# Patient Record
Sex: Female | Born: 1940 | Race: White | Hispanic: No | Marital: Married | Smoking: Former smoker
Health system: Southern US, Community
[De-identification: ages and names within clinical notes are randomized; demographics above are authoritative.]

## PROBLEM LIST (undated history)

## (undated) DIAGNOSIS — G5600 Carpal tunnel syndrome, unspecified upper limb: Secondary | ICD-10-CM

## (undated) DIAGNOSIS — F419 Anxiety disorder, unspecified: Secondary | ICD-10-CM

## (undated) DIAGNOSIS — R42 Dizziness and giddiness: Secondary | ICD-10-CM

## (undated) DIAGNOSIS — T887XXA Unspecified adverse effect of drug or medicament, initial encounter: Secondary | ICD-10-CM

## (undated) DIAGNOSIS — G40309 Generalized idiopathic epilepsy and epileptic syndromes, not intractable, without status epilepticus: Secondary | ICD-10-CM

## (undated) DIAGNOSIS — R251 Tremor, unspecified: Secondary | ICD-10-CM

## (undated) DIAGNOSIS — R413 Other amnesia: Secondary | ICD-10-CM

## (undated) HISTORY — DX: Anxiety disorder, unspecified: F41.9

## (undated) HISTORY — DX: Unspecified adverse effect of drug or medicament, initial encounter: T88.7XXA

## (undated) HISTORY — DX: Dizziness and giddiness: R42

## (undated) HISTORY — DX: Carpal tunnel syndrome, unspecified upper limb: G56.00

## (undated) HISTORY — PX: ABDOMINAL HYSTERECTOMY: SHX81

## (undated) HISTORY — DX: Other amnesia: R41.3

## (undated) HISTORY — PX: TONSILLECTOMY AND ADENOIDECTOMY: SHX28

## (undated) HISTORY — DX: Generalized idiopathic epilepsy and epileptic syndromes, not intractable, without status epilepticus: G40.309

## (undated) HISTORY — PX: APPENDECTOMY: SHX54

---

## 2007-02-20 ENCOUNTER — Emergency Department: Payer: Self-pay | Admitting: Emergency Medicine

## 2007-02-20 ENCOUNTER — Other Ambulatory Visit: Payer: Self-pay

## 2007-03-07 ENCOUNTER — Ambulatory Visit: Payer: Self-pay | Admitting: Internal Medicine

## 2007-07-04 ENCOUNTER — Emergency Department: Payer: Self-pay | Admitting: Emergency Medicine

## 2007-07-04 ENCOUNTER — Other Ambulatory Visit: Payer: Self-pay

## 2012-01-27 ENCOUNTER — Ambulatory Visit: Payer: Self-pay | Admitting: Ophthalmology

## 2012-02-10 ENCOUNTER — Ambulatory Visit: Payer: Self-pay | Admitting: Ophthalmology

## 2013-01-02 ENCOUNTER — Encounter: Payer: Self-pay | Admitting: Neurology

## 2013-01-02 ENCOUNTER — Ambulatory Visit (INDEPENDENT_AMBULATORY_CARE_PROVIDER_SITE_OTHER): Payer: No Typology Code available for payment source | Admitting: Neurology

## 2013-01-02 VITALS — BP 121/64 | HR 71 | Ht 64.0 in | Wt 139.0 lb

## 2013-01-02 DIAGNOSIS — F419 Anxiety disorder, unspecified: Secondary | ICD-10-CM

## 2013-01-02 DIAGNOSIS — R42 Dizziness and giddiness: Secondary | ICD-10-CM | POA: Insufficient documentation

## 2013-01-02 DIAGNOSIS — R569 Unspecified convulsions: Secondary | ICD-10-CM

## 2013-01-02 DIAGNOSIS — R413 Other amnesia: Secondary | ICD-10-CM

## 2013-01-02 DIAGNOSIS — G5600 Carpal tunnel syndrome, unspecified upper limb: Secondary | ICD-10-CM | POA: Insufficient documentation

## 2013-01-02 DIAGNOSIS — T887XXA Unspecified adverse effect of drug or medicament, initial encounter: Secondary | ICD-10-CM

## 2013-01-02 MED ORDER — ZONISAMIDE 100 MG PO CAPS: 100.0000 mg | ORAL_CAPSULE | Freq: Every day | ORAL | Status: DC

## 2013-01-02 NOTE — Progress Notes (Signed)
History of Present Illness:    Nicole Burnett is a 72 year old right-handed white married female with a history of nocturnal seizures x2 occurring 02/20/2007 and 07/04/2007, accompanied by her husband.  CT scan of the brain, EEG x2, and MRI studies of the brain with and without contrast enhancement 05/16/2007 and 12/17/2008 are normal. The latter was read as showing atrophy. She has not had recurrent seizures and denies macropsia, micropsia, dj vu, strange odors or tastes. She is independent activities of daily living, enjoys playing golf, and drives a car. She has been concerned about memory loss but this has not been noted by her husband. She developed benign paroxysmal positional vertigo with right posterior ear canal disease which I treated 12/06/2008 with a modified Epley maneuver and she has done well without recurrent symptoms.  She complains of ringing in the ears right greater than left which is constant without hearing loss. She is not having side effects from her medication. She states her memory is " the same". She is independent in all activities of daily living and pays the monthly bills .She enjoys memory gains that are word  games. Zonogram causes her food to  taste like "there is iron in it". She denies auras of seizures.  She is on one baby aspirin per day.    Last blood work 06/24/10 by Dr. Arlana Pouch. She has been under stress,while  while  handling her brother's estate following his death. She has glaucoma and has undergone laser surgery. She has bilateral cataracts followed by Dr. Sharman Crate and had left eye surgery 12/2011 and right eye surgery 01/2012. She exercises by playing golf. She does not do other forms of exercise during the winter.Her last DEXA scan was 01/18/2012 with lowest  T score  -1.5 of the  right femoral neck. last  troughs in a semi-level 10.3 on 12/29/2011 with therapeutic 10-40. She complains of feeling tired.  She sleeps well at night.  She does not snore. She does not take daytime  naps.   UPDATE July 14th 2014: She is doing very well, there was no recurrent seizure, zonisamide trough level was 9.8 in January 2014, Her medication was increased from 100 twice a day to 100 in the morning 200 every night.  She denies significant side effects, she denies dizziness.   ROS:   Ringing in ears, anxiety, decreased energy, memory loss.  Physical Exam  General:  well-developed white female.Weight 142.5 pounds. Sitting blood pressure right arm 140/80 and  left arm 130/80. Heart rate 68 and regular Right shoulder higher than the left. Mild scoliosis  Neurologic Exam  Mental Status: Alert and oriented to time, place, and person. MMSE 30/30 Cranial Nerves: . visual fields full. discs deeply cupped Status post cataract surgery bilaterally. Extraocular movements full.  Exophoria Corneals are present.  Hearing intact.  Air conduction greater than bone conduction.  No facial asymmetry.  Tongue midline, uvula midline, and gag present.  Sternocleidomastoid and trapezius testing normal. Motor: 5/5 strength proximally and distally in the upper and lower extremities.  No evidence of proximal, pronator, or distal drift.  No focal atrophy and no fasciculations seen. Sensory: Intact to pinprick, light touch, joint position, and vibration testing.    Coordination:  Outstretched hand and arm tremor.  Intact finger-to-nose, heel-to-shin, and rapid alernating movements.  No evidence of rebound. Gait and Station: Can toe walk, heel walk, and tandem gait.  Romberg testing is negative. Can walk up and down the hall 6x with  5 turns in 48 seconds  Reflexes: Postural and righting reflexes are normal.  DTR 2+ and equal.  Plantar responses downgoing.  Assessment and plan: 72 years old Caucasian female, with  history generalized major motor seizures x2, doing very well with zonisamide 100 mg at noon, 2 tablets every night,  Keep current dose of medications, refill her prescription, return to clinic as needed,  further prescription can be field by her primary care physician Dr. Arlana Pouch.

## 2013-01-25 ENCOUNTER — Other Ambulatory Visit: Payer: Self-pay

## 2013-04-27 ENCOUNTER — Other Ambulatory Visit: Payer: Self-pay

## 2013-10-13 ENCOUNTER — Ambulatory Visit: Payer: Self-pay | Admitting: Unknown Physician Specialty

## 2014-01-07 ENCOUNTER — Other Ambulatory Visit: Payer: Self-pay | Admitting: Neurology

## 2014-02-06 ENCOUNTER — Other Ambulatory Visit: Payer: Self-pay | Admitting: Neurology

## 2014-02-06 NOTE — Telephone Encounter (Signed)
Last OV note says:  zonisamide 100 mg at noon, 2 tablets every night,  Keep current dose of medications, refill her prescription, return to clinic as needed, further prescription can be field by her primary care physician Dr. Arlana Pouchate.

## 2014-03-07 ENCOUNTER — Other Ambulatory Visit: Payer: Self-pay | Admitting: Neurology

## 2017-01-20 DEATH — deceased

## 2018-02-01 ENCOUNTER — Encounter: Payer: Self-pay | Admitting: Emergency Medicine

## 2018-02-01 ENCOUNTER — Emergency Department: Payer: Medicare HMO

## 2018-02-01 ENCOUNTER — Observation Stay
Admission: EM | Admit: 2018-02-01 | Discharge: 2018-02-02 | Disposition: A | Discharge status: Home / Self Care | Payer: Medicare HMO | Attending: Internal Medicine | Admitting: Internal Medicine

## 2018-02-01 ENCOUNTER — Other Ambulatory Visit: Payer: Self-pay

## 2018-02-01 DIAGNOSIS — Z8249 Family history of ischemic heart disease and other diseases of the circulatory system: Secondary | ICD-10-CM | POA: Insufficient documentation

## 2018-02-01 DIAGNOSIS — R52 Pain, unspecified: Secondary | ICD-10-CM | POA: Diagnosis present

## 2018-02-01 DIAGNOSIS — Z7982 Long term (current) use of aspirin: Secondary | ICD-10-CM | POA: Diagnosis not present

## 2018-02-01 DIAGNOSIS — R413 Other amnesia: Secondary | ICD-10-CM | POA: Diagnosis not present

## 2018-02-01 DIAGNOSIS — Z87891 Personal history of nicotine dependence: Secondary | ICD-10-CM | POA: Diagnosis not present

## 2018-02-01 DIAGNOSIS — Z888 Allergy status to other drugs, medicaments and biological substances status: Secondary | ICD-10-CM | POA: Diagnosis not present

## 2018-02-01 DIAGNOSIS — G5600 Carpal tunnel syndrome, unspecified upper limb: Secondary | ICD-10-CM | POA: Diagnosis not present

## 2018-02-01 DIAGNOSIS — F419 Anxiety disorder, unspecified: Secondary | ICD-10-CM | POA: Diagnosis not present

## 2018-02-01 DIAGNOSIS — Z91030 Bee allergy status: Secondary | ICD-10-CM | POA: Diagnosis not present

## 2018-02-01 DIAGNOSIS — G40909 Epilepsy, unspecified, not intractable, without status epilepticus: Secondary | ICD-10-CM | POA: Insufficient documentation

## 2018-02-01 DIAGNOSIS — R42 Dizziness and giddiness: Secondary | ICD-10-CM | POA: Insufficient documentation

## 2018-02-01 DIAGNOSIS — N200 Calculus of kidney: Secondary | ICD-10-CM

## 2018-02-01 DIAGNOSIS — Z79899 Other long term (current) drug therapy: Secondary | ICD-10-CM | POA: Insufficient documentation

## 2018-02-01 DIAGNOSIS — Z886 Allergy status to analgesic agent status: Secondary | ICD-10-CM | POA: Insufficient documentation

## 2018-02-01 DIAGNOSIS — Z90710 Acquired absence of both cervix and uterus: Secondary | ICD-10-CM | POA: Diagnosis not present

## 2018-02-01 DIAGNOSIS — N132 Hydronephrosis with renal and ureteral calculous obstruction: Secondary | ICD-10-CM | POA: Diagnosis not present

## 2018-02-01 LAB — BASIC METABOLIC PANEL
Anion gap: 9 (ref 5–15)
BUN: 25 mg/dL — AB (ref 8–23)
CHLORIDE: 106 mmol/L (ref 98–111)
CO2: 25 mmol/L (ref 22–32)
CREATININE: 0.77 mg/dL (ref 0.44–1.00)
Calcium: 9.8 mg/dL (ref 8.9–10.3)
GFR calc Af Amer: >60 mL/min (ref >60)
GFR calc non Af Amer: >60 mL/min (ref >60)
Glucose, Bld: 113 mg/dL — ABNORMAL HIGH (ref 70–99)
Potassium: 4 mmol/L (ref 3.5–5.1)
Sodium: 140 mmol/L (ref 135–145)

## 2018-02-01 LAB — URINALYSIS, COMPLETE (UACMP) WITH MICROSCOPIC
BILIRUBIN URINE: NEGATIVE
Bacteria, UA: NONE SEEN
GLUCOSE, UA: NEGATIVE mg/dL
Ketones, ur: NEGATIVE mg/dL
NITRITE: NEGATIVE
Protein, ur: NEGATIVE mg/dL
Specific Gravity, Urine: 1.016 (ref 1.005–1.030)
pH: 6 (ref 5.0–8.0)

## 2018-02-01 LAB — CBC
HCT: 43.6 % (ref 35.0–47.0)
HEMOGLOBIN: 14.4 g/dL (ref 12.0–16.0)
MCH: 33.5 pg (ref 26.0–34.0)
MCHC: 33.1 g/dL (ref 32.0–36.0)
MCV: 101.3 fL — ABNORMAL HIGH (ref 80.0–100.0)
PLATELETS: 223 10*3/uL (ref 150–440)
RBC: 4.31 MIL/uL (ref 3.80–5.20)
RDW: 13.3 % (ref 11.5–14.5)
WBC: 8.1 10*3/uL (ref 3.6–11.0)

## 2018-02-01 MED ORDER — ONDANSETRON HCL 4 MG/2ML IJ SOLN
4.0000 mg | Freq: Once | INTRAMUSCULAR | Status: AC
  Administered 2018-02-01: 4 mg via INTRAVENOUS
  Filled 2018-02-01: qty 2

## 2018-02-01 MED ORDER — OXYCODONE HCL 5 MG PO TABS
5.0000 mg | ORAL_TABLET | ORAL | Status: DC | PRN
  Administered 2018-02-02: 5 mg via ORAL
  Filled 2018-02-01: qty 1

## 2018-02-01 MED ORDER — SODIUM CHLORIDE 0.9 % IV SOLN
INTRAVENOUS | Status: AC
  Administered 2018-02-02: via INTRAVENOUS

## 2018-02-01 MED ORDER — OXYCODONE-ACETAMINOPHEN 5-325 MG PO TABS
ORAL_TABLET | ORAL | Status: AC
  Filled 2018-02-01: qty 1

## 2018-02-01 MED ORDER — ONDANSETRON HCL 4 MG/2ML IJ SOLN: 4.0000 mg | Freq: Four times a day (QID) | INTRAMUSCULAR | Status: DC | PRN

## 2018-02-01 MED ORDER — MORPHINE SULFATE (PF) 2 MG/ML IV SOLN
2.0000 mg | Freq: Once | INTRAVENOUS | Status: AC
  Administered 2018-02-01: 2 mg via INTRAVENOUS
  Filled 2018-02-01: qty 1

## 2018-02-01 MED ORDER — ACETAMINOPHEN 650 MG RE SUPP
650.0000 mg | Freq: Four times a day (QID) | RECTAL | Status: DC | PRN
Start: 2018-02-01 — End: 2018-02-02

## 2018-02-01 MED ORDER — ONDANSETRON HCL 4 MG PO TABS: 4.0000 mg | ORAL_TABLET | Freq: Four times a day (QID) | ORAL | Status: DC | PRN

## 2018-02-01 MED ORDER — ENOXAPARIN SODIUM 40 MG/0.4ML ~~LOC~~ SOLN
40.0000 mg | SUBCUTANEOUS | Status: DC
  Administered 2018-02-02: 40 mg via SUBCUTANEOUS
  Filled 2018-02-01: qty 0.4

## 2018-02-01 MED ORDER — ZONISAMIDE 100 MG PO CAPS
200.0000 mg | ORAL_CAPSULE | Freq: Every day | ORAL | Status: DC
  Filled 2018-02-01 (×2): qty 2

## 2018-02-01 MED ORDER — ZONISAMIDE 100 MG PO CAPS
100.0000 mg | ORAL_CAPSULE | Freq: Every day | ORAL | Status: DC
  Administered 2018-02-02: 100 mg via ORAL
  Filled 2018-02-01: qty 1

## 2018-02-01 MED ORDER — LATANOPROST 0.005 % OP SOLN
1.0000 [drp] | Freq: Every day | OPHTHALMIC | Status: DC
  Administered 2018-02-02: 1 [drp] via OPHTHALMIC
  Filled 2018-02-01: qty 2.5

## 2018-02-01 MED ORDER — SODIUM CHLORIDE 0.9 % IV BOLUS
500.0000 mL | Freq: Once | INTRAVENOUS | Status: AC
  Administered 2018-02-01: 500 mL via INTRAVENOUS

## 2018-02-01 MED ORDER — ZONISAMIDE 100 MG PO CAPS
100.0000 mg | ORAL_CAPSULE | Freq: Two times a day (BID) | ORAL | Status: DC
Start: 2018-02-01 — End: 2018-02-01

## 2018-02-01 MED ORDER — ACETAMINOPHEN 325 MG PO TABS: 650.0000 mg | ORAL_TABLET | Freq: Four times a day (QID) | ORAL | Status: DC | PRN

## 2018-02-01 MED ORDER — MORPHINE SULFATE (PF) 4 MG/ML IV SOLN
4.0000 mg | Freq: Once | INTRAVENOUS | Status: AC
  Administered 2018-02-01: 4 mg via INTRAVENOUS
  Filled 2018-02-01: qty 1

## 2018-02-01 MED ORDER — BRIMONIDINE TARTRATE 0.15 % OP SOLN
1.0000 [drp] | Freq: Two times a day (BID) | OPHTHALMIC | Status: DC
  Administered 2018-02-02 (×2): 1 [drp] via OPHTHALMIC
  Filled 2018-02-01: qty 5

## 2018-02-01 MED ORDER — OXYCODONE-ACETAMINOPHEN 5-325 MG PO TABS
1.0000 | ORAL_TABLET | ORAL | Status: DC | PRN
  Administered 2018-02-01: 1 via ORAL

## 2018-02-01 MED ORDER — MORPHINE SULFATE (PF) 4 MG/ML IV SOLN
4.0000 mg | INTRAVENOUS | Status: DC | PRN
  Administered 2018-02-02: 4 mg via INTRAVENOUS
  Filled 2018-02-01: qty 1

## 2018-02-01 NOTE — ED Triage Notes (Signed)
Patient reports left-sided flank pain that started yesterday and comes and goes. Denies history of kidney stones. Denies urinary symptoms or blood in urine.

## 2018-02-01 NOTE — H&P (Signed)
[facility] [place] at [facility]   PATIENT NAME: Nicole Burnett    MR#:  [id number]  DATE OF BIRTH:  Nov 08, 1940  DATE OF ADMISSION:  02/01/2018  PRIMARY CARE PHYSICIAN: Jaclyn Shaggyate, Denny C, MD   REQUESTING/REFERRING PHYSICIAN: Fanny BienQuale, MD  CHIEF COMPLAINT:   Chief Complaint  Patient presents with  . Flank Pain    HISTORY OF PRESENT ILLNESS:  Nicole Burnett  is a 77 y.o. female who presents with chief complaint as above.  1 day of left flank and abdominal discomfort.  Patient states this discomfort would come and sharp waves and then improved.  Initially yesterday it was not very aggressive, but it woke her up through the night, then this afternoon it became much more intense.  She came to the ED for evaluation and was found to have a left 6 mm kidney stone.  There are no signs of infection, and hospitalist were called for admission for pain control and supportive treatment  PAST MEDICAL HISTORY:   Past Medical History:  Diagnosis Date  . Anxiety   . Carpal tunnel syndrome   . Dizziness and giddiness   . Generalized convulsive epilepsy without mention of intractable epilepsy   . Memory loss   . Unspecified adverse effect of unspecified drug, medicinal and biological substance      PAST SURGICAL HISTORY:   Past Surgical History:  Procedure Laterality Date  . ABDOMINAL HYSTERECTOMY    . TONSILLECTOMY AND ADENOIDECTOMY       SOCIAL HISTORY:   Social History   Tobacco Use  . Smoking status: Former Smoker    Types: Cigarettes  . Smokeless tobacco: Never Used  . Tobacco comment: Quit in 1960  Substance Use Topics  . Alcohol use: Yes    Alcohol/week: 1.0 standard drinks    Types: 1 Glasses of wine per week    Comment: OCC     FAMILY HISTORY:   Family History  Problem Relation Age of Onset  . Mitral valve prolapse Mother   . Heart disease Unknown      DRUG ALLERGIES:   Allergies  Allergen Reactions  . Bee Venom Anaphylaxis  .  Lamotrigine Rash  . Levetiracetam Rash  . Motrin [Ibuprofen] Rash    MEDICATIONS AT HOME:   Prior to Admission medications   Medication Sig Start Date End Date Taking? Authorizing Provider  aspirin 81 MG tablet Take 81 mg by mouth daily.    [provider]  brimonidine (ALPHAGAN P) 0.1 % SOLN Place 1 drop into the left eye 2 (two) times daily.     [provider]  Diclofenac-Misoprostol (ARTHROTEC) 75-0.2 MG TBEC Take by mouth daily.    [provider]  latanoprost (XALATAN) 0.005 % ophthalmic solution Place 1 drop into both eyes at bedtime.     [provider]  Multiple Vitamins-Minerals (CENTRUM SILVER PO) Take 1 tablet by mouth daily.     [provider]  omeprazole (PRILOSEC) 20 MG capsule Take 20 mg by mouth daily.    [provider]  Polyethyl Glycol-Propyl Glycol (SYSTANE) 0.4-0.3 % SOLN Apply to eye as directed.    [provider]  zonisamide (ZONEGRAN) 100 MG capsule TAKE 1 CAPSULE BY MOUTH DAILY AND 2 CAPSULES EVERY NIGHT AT BEDTIME Patient taking differently: Take 100-200 mg by mouth 2 (two) times daily. TAKE 1 CAPSULE BY MOUTH DAILY AND 2 CAPSULES EVERY NIGHT AT BEDTIME 03/08/14   Levert FeinsteinYan, Yijun, MD    REVIEW OF SYSTEMS:  Review of Systems  Constitutional: Negative for chills, fever, malaise/fatigue and weight loss.  HENT: Negative for ear pain, hearing loss and tinnitus.   Eyes: Negative for blurred vision, double vision, pain and redness.  Respiratory: Negative for cough, hemoptysis and shortness of breath.   Cardiovascular: Negative for chest pain, palpitations, orthopnea and leg swelling.  Gastrointestinal: Negative for abdominal pain, constipation, diarrhea, nausea and vomiting.  Genitourinary: Positive for dysuria and flank pain. Negative for frequency and hematuria.  Musculoskeletal: Negative for back pain, joint pain and neck pain.  Skin:       No acne, rash, or lesions  Neurological: Negative for dizziness,  tremors, focal weakness and weakness.  Endo/Heme/Allergies: Negative for polydipsia. Does not bruise/bleed easily.  Psychiatric/Behavioral: Negative for depression. The patient is not nervous/anxious and does not have insomnia.      VITAL SIGNS:   Vitals:   02/01/18 1817 02/01/18 2200  BP: (!) 133/58 (!) 109/91  Pulse: 86 88  Resp: 18 14  Temp: 98 F (36.7 C)   TempSrc: Oral   SpO2: 99% (!) 89%  Weight: 57.6 kg   Height: 5\' 4"  (1.626 m)    Wt Readings from Last 3 Encounters:  02/01/18 57.6 kg  01/02/13 63 kg    PHYSICAL EXAMINATION:  Physical Exam  Vitals reviewed. Constitutional: She is oriented to person, place, and time. She appears well-developed and well-nourished. No distress.  HENT:  Head: Normocephalic and atraumatic.  Mouth/Throat: Oropharynx is clear and moist.  Eyes: Pupils are equal, round, and reactive to light. Conjunctivae and EOM are normal. No scleral icterus.  Neck: Normal range of motion. Neck supple. No JVD present. No thyromegaly present.  Cardiovascular: Normal rate, regular rhythm and intact distal pulses. Exam reveals no gallop and no friction rub.  No murmur heard. Respiratory: Effort normal and breath sounds normal. No respiratory distress. She has no wheezes. She has no rales.  GI: Soft. Bowel sounds are normal. She exhibits no distension. There is tenderness.  Musculoskeletal: Normal range of motion. She exhibits no edema.  No arthritis, no gout  Lymphadenopathy:    She has no cervical adenopathy.  Neurological: She is alert and oriented to person, place, and time. No cranial nerve deficit.  No dysarthria, no aphasia  Skin: Skin is warm and dry. No rash noted. No erythema.  Psychiatric: She has a normal mood and affect. Her behavior is normal. Judgment and thought content normal.    LABORATORY PANEL:   CBC Recent Labs  Lab 02/01/18 1819  WBC 8.1  HGB 14.4  HCT 43.6  PLT 223    ------------------------------------------------------------------------------------------------------------------  Chemistries  Recent Labs  Lab 02/01/18 1819  NA 140  K 4.0  CL 106  CO2 25  GLUCOSE 113*  BUN 25*  CREATININE 0.77  CALCIUM 9.8   ------------------------------------------------------------------------------------------------------------------  Cardiac Enzymes No results for input(s): TROPONINI in the last 168 hours. ------------------------------------------------------------------------------------------------------------------  RADIOLOGY:  Ct Renal Stone Study  Result Date: 02/01/2018 CLINICAL DATA:  Left-sided flank pain for 2 days EXAM: CT ABDOMEN AND PELVIS WITHOUT CONTRAST TECHNIQUE: Multidetector CT imaging of the abdomen and pelvis was performed following the standard protocol without IV contrast. COMPARISON:  None. FINDINGS: Lower chest: No acute abnormality. Hepatobiliary: No focal liver abnormality is seen. No gallstones, gallbladder wall thickening, or biliary dilatation. Pancreas: Unremarkable. No pancreatic ductal dilatation or surrounding inflammatory changes. Spleen: Normal in size without focal abnormality. Adrenals/Urinary Tract: The adrenal glands are within normal limits bilaterally. Kidneys are well visualized bilaterally. The right  kidney is within normal limits with scattered parapelvic cysts identified. The left kidney also demonstrates mild peripelvic cysts although hydronephrosis and proximal hydroureter is noted secondary to a 6 mm proximal left ureteral stone. The more distal left ureter is within normal limits. Tiny left nonobstructing renal calculi are noted. The bladder is decompressed. Stomach/Bowel: Scattered diverticular change of the colon is noted without evidence of diverticulitis. No obstructive or inflammatory changes are seen. The appendix is not well visualized. No inflammatory changes are noted. Hiatal hernia is noted.  Vascular/Lymphatic: Aortic atherosclerosis. No enlarged abdominal or pelvic lymph nodes. Reproductive: Status post hysterectomy. No adnexal masses. Other: No abdominal wall hernia or abnormality. No abdominopelvic ascites. Musculoskeletal: Degenerative changes are noted. IMPRESSION: Left proximal ureteral stone with obstructive change. Tiny nonobstructing left renal calculi. Chronic changes as described above. Electronically Signed   By: Alcide Clever M.D.   On: 02/01/2018 20:59    EKG:   Orders placed or performed in visit on 07/04/07  . EKG 12-Lead    IMPRESSION AND PLAN:  Principal Problem:   Kidney stone on left side -patient has no prior history of kidney stones, she has a 6 mm left-sided stone.  IV fluids, PRN analgesia, urology consult Active Problems:   Anxiety -home dose anxiolytic   Seizure disorder (HCC) -home dose antiepileptic  Chart review performed and case discussed with ED provider. Labs, imaging and/or ECG reviewed by provider and discussed with patient/family. Management plans discussed with the patient and/or family.  DVT PROPHYLAXIS: SubQ lovenox   GI PROPHYLAXIS:  None  ADMISSION STATUS:   Observation  CODE STATUS: Full  TOTAL TIME TAKING CARE OF THIS PATIENT: 40 minutes.   Anne Hahn, Betzabeth Derringer FIELDING 02/01/2018, 10:12 PM  [employer]  [phone]  CC: Primary care physician; Jaclyn Shaggy, MD  Note:  This document was prepared using Dragon voice recognition software and may include unintentional dictation errors.

## 2018-02-01 NOTE — ED Notes (Signed)
Patient transported to CT 

## 2018-02-01 NOTE — ED Provider Notes (Signed)
[facility] Emergency Department Provider Note   ____________________________________________   First MD Initiated Contact with Patient 02/01/18 2014     (approximate)  I have reviewed the triage vital signs and the nursing notes.   HISTORY  Chief Complaint Flank Pain    HPI Nicole Burnett is a 77 y.o. female previous medical history of seizure disorder, reports she is compliant with her seizure medicine has not had a seizure in a long time  Yesterday she been experiencing a slight discomfort in her left flank, this evening she had a severe worsening severe pain in the left lower flank that she reports is about 150 out of 10 on the pain scale.  Very sharp located in her left lower flank and back.  No pain or burning with urination.  Feels an urgency to urinate.  No chest pain or trouble breathing.  No fevers or chills.  Is never had similar pain.  No history of any kidney stones.  She reports she thinks she might have a "kidney stone".  No numbness or weakness in the feet or legs.  Allergic to ibuprofen and NSAID medications.   Past Medical History:  Diagnosis Date  . Anxiety   . Carpal tunnel syndrome   . Dizziness and giddiness   . Generalized convulsive epilepsy without mention of intractable epilepsy   . Memory loss   . Unspecified adverse effect of unspecified drug, medicinal and biological substance     Patient Active Problem List   Diagnosis Date Noted  . Memory loss   . Dizziness and giddiness   . Carpal tunnel syndrome   . Anxiety   . Unspecified adverse effect of unspecified drug, medicinal and biological substance     Past Surgical History:  Procedure Laterality Date  . ABDOMINAL HYSTERECTOMY    . TONSILLECTOMY AND ADENOIDECTOMY      Prior to Admission medications   Medication Sig Start Date End Date Taking? Authorizing Provider  aspirin 81 MG tablet Take 81 mg by mouth daily.    [provider]  brimonidine  (ALPHAGAN P) 0.1 % SOLN as directed.    [provider]  Diclofenac-Misoprostol (ARTHROTEC) 75-0.2 MG TBEC Take by mouth daily.    [provider]  latanoprost (XALATAN) 0.005 % ophthalmic solution 1 drop at bedtime.    [provider]  Multiple Vitamins-Minerals (CENTRUM SILVER PO) Take by mouth daily.    [provider]  omeprazole (PRILOSEC) 20 MG capsule Take 20 mg by mouth daily.    [provider]  Polyethyl Glycol-Propyl Glycol (SYSTANE) 0.4-0.3 % SOLN Apply to eye as directed.    [provider]  zonisamide (ZONEGRAN) 100 MG capsule TAKE 1 CAPSULE BY MOUTH DAILY AND 2 CAPSULES EVERY NIGHT AT BEDTIME 03/08/14   Levert FeinsteinYan, Yijun, MD    Allergies Beesix [pyridoxine]; Lamotrigine; Levetiracetam; and Motrin [ibuprofen]  Family History  Problem Relation Age of Onset  . Mitral valve prolapse Mother   . Heart disease Unknown     Social History Social History   Tobacco Use  . Smoking status: Former Smoker    Types: Cigarettes  . Smokeless tobacco: Never Used  . Tobacco comment: Quit in 1960  Substance Use Topics  . Alcohol use: Yes    Alcohol/week: 1.0 standard drinks    Types: 1 Glasses of wine per week    Comment: OCC  . Drug use: No    Review of Systems Constitutional: No fever/chills Eyes: No visual changes. ENT:  No sore throat. Cardiovascular: Denies chest pain. Respiratory: Denies shortness of breath. Gastrointestinal: No pain except in the left flank and pelvis.  No nausea, no vomiting.  No diarrhea.  No constipation. Genitourinary: Negative for dysuria.  See HPI Musculoskeletal: Negative for back pain except in her left lower back. Skin: Negative for rash. Neurological: Negative for headaches, focal weakness or numbness.    ____________________________________________   PHYSICAL EXAM:  VITAL SIGNS: ED Triage Vitals  Enc Vitals Group     BP 02/01/18 1817 (!) 133/58     Pulse Rate 02/01/18 1817 86     Resp  02/01/18 1817 18     Temp 02/01/18 1817 98 F (36.7 C)     Temp Source 02/01/18 1817 Oral     SpO2 02/01/18 1817 99 %     Weight 02/01/18 1817 127 lb (57.6 kg)     Height 02/01/18 1817 5\' 4"  (1.626 m)     Head Circumference --      Peak Flow --      Pain Score 02/01/18 1826 8     Pain Loc --      Pain Edu? --      Excl. in GC? --     Constitutional: Alert and oriented.  Writhing in pain, appears in severe pain holding her left flank. Eyes: Conjunctivae are normal. Head: Atraumatic. Nose: No congestion/rhinnorhea. Mouth/Throat: Mucous membranes are moist. Neck: No stridor.   Cardiovascular: Normal rate, regular rhythm. Grossly normal heart sounds.  Good peripheral circulation. Respiratory: Normal respiratory effort.  No retractions. Lungs CTAB. Gastrointestinal: Soft and nontender. No distention.  Severe left CVA tenderness. Musculoskeletal: No lower extremity tenderness nor edema. Neurologic:  Normal speech and language. No gross focal neurologic deficits are appreciated.  Skin:  Skin is warm, dry and intact. No rash noted. Psychiatric: Mood and affect are normal. Speech and behavior are normal.  ____________________________________________   LABS (all labs ordered are listed, but only abnormal results are displayed)  Labs Reviewed  URINALYSIS, COMPLETE (UACMP) WITH MICROSCOPIC - Abnormal; Notable for the following components:      Result Value   Color, Urine YELLOW (*)    APPearance CLEAR (*)    Hgb urine dipstick SMALL (*)    Leukocytes, UA SMALL (*)    All other components within normal limits  CBC - Abnormal; Notable for the following components:   MCV 101.3 (*)    All other components within normal limits  BASIC METABOLIC PANEL - Abnormal; Notable for the following components:   Glucose, Bld 113 (*)    BUN 25 (*)    All other components within normal limits    ____________________________________________  EKG   ____________________________________________  RADIOLOGY  Ct Renal Stone Study  Result Date: 02/01/2018 CLINICAL DATA:  Left-sided flank pain for 2 days EXAM: CT ABDOMEN AND PELVIS WITHOUT CONTRAST TECHNIQUE: Multidetector CT imaging of the abdomen and pelvis was performed following the standard protocol without IV contrast. COMPARISON:  None. FINDINGS: Lower chest: No acute abnormality. Hepatobiliary: No focal liver abnormality is seen. No gallstones, gallbladder wall thickening, or biliary dilatation. Pancreas: Unremarkable. No pancreatic ductal dilatation or surrounding inflammatory changes. Spleen: Normal in size without focal abnormality. Adrenals/Urinary Tract: The adrenal glands are within normal limits bilaterally. Kidneys are well visualized bilaterally. The right kidney is within normal limits with scattered parapelvic cysts identified. The left kidney also demonstrates mild peripelvic cysts although hydronephrosis and proximal hydroureter is noted secondary to a 6 mm proximal left ureteral stone. The  more distal left ureter is within normal limits. Tiny left nonobstructing renal calculi are noted. The bladder is decompressed. Stomach/Bowel: Scattered diverticular change of the colon is noted without evidence of diverticulitis. No obstructive or inflammatory changes are seen. The appendix is not well visualized. No inflammatory changes are noted. Hiatal hernia is noted. Vascular/Lymphatic: Aortic atherosclerosis. No enlarged abdominal or pelvic lymph nodes. Reproductive: Status post hysterectomy. No adnexal masses. Other: No abdominal wall hernia or abnormality. No abdominopelvic ascites. Musculoskeletal: Degenerative changes are noted. IMPRESSION: Left proximal ureteral stone with obstructive change. Tiny nonobstructing left renal calculi. Chronic changes as described above. Electronically Signed   By: Alcide CleverMark  Lukens M.D.   On: 02/01/2018  20:59    CT and clinical history discussed with urology Dr. Lonna CobbStoioff who advises pain control and if needed he will be able to provide inpatient consultation if patient requires admission. ____________________________________________   PROCEDURES  Procedure(s) performed: None  Procedures  Critical Care performed: No  ____________________________________________   INITIAL IMPRESSION / ASSESSMENT AND PLAN / ED COURSE  Pertinent labs & imaging results that were available during my care of the patient were reviewed by me and considered in my medical decision making (see chart for details).  Differential diagnosis includes but is not limited to, abdominal perforation, aortic dissection, cholecystitis, appendicitis, diverticulitis, colitis, esophagitis/gastritis, kidney stone, pyelonephritis, urinary tract infection, aortic aneurysm. All are considered in decision and treatment plan. Based upon the patient's presentation and risk factors, I suspect this is likely stone disease based on her clinical presentation and history.  Urinalysis also revealing hematuria without evidence of infection.   Clinical Course as of Feb 02 2119  Tue Feb 01, 2018  2055 Patient again writhing in pain reporting ongoing severe pain in the left lower flank and pelvic region.  She appears quite uncomfortable and again reports pain 10 out of 10 after 4 mg morphine and Percocet.  Vital signs are normal, will give additional have ordered additional morphine at this time.   [MQ]    Clinical Course User Index [MQ] Sharyn CreamerQuale, Ovella Manygoats, MD    ----------------------------------------- 9:14 PM on 02/01/2018 -----------------------------------------  Multiple dose of morphine, continues to have notable left flank pain.  Discussed with urology who recommends the patient could be admitted to hospitalist service and he will provide consultation and ongoing care and treatment planning.  Patient pain has improved somewhat but she  still appears quite uncomfortable, she cannot receive NSAIDs, and I feel the doses of morphine and narcotic she is required receiving for pain control would not be reasonably accomplished at home. ____________________________________________   FINAL CLINICAL IMPRESSION(S) / ED DIAGNOSES  Final diagnoses:  Kidney stone on left side  Intractable pain      NEW MEDICATIONS STARTED DURING THIS VISIT:  New Prescriptions   No medications on file     Note:  This document was prepared using Dragon voice recognition software and may include unintentional dictation errors.     Sharyn CreamerQuale, Chevelle Coulson, MD 02/01/18 2120

## 2018-02-01 NOTE — ED Notes (Signed)
Christina RN, aware of ready bed

## 2018-02-02 ENCOUNTER — Observation Stay: Payer: Medicare HMO

## 2018-02-02 ENCOUNTER — Encounter: Payer: Self-pay | Admitting: *Deleted

## 2018-02-02 ENCOUNTER — Encounter
Admission: EM | Disposition: A | Discharge status: Home / Self Care | Payer: Self-pay | Source: Home / Self Care | Attending: Emergency Medicine

## 2018-02-02 ENCOUNTER — Observation Stay: Payer: Medicare HMO | Admitting: Anesthesiology

## 2018-02-02 DIAGNOSIS — N200 Calculus of kidney: Secondary | ICD-10-CM | POA: Diagnosis not present

## 2018-02-02 DIAGNOSIS — N201 Calculus of ureter: Secondary | ICD-10-CM

## 2018-02-02 DIAGNOSIS — R52 Pain, unspecified: Secondary | ICD-10-CM | POA: Diagnosis not present

## 2018-02-02 DIAGNOSIS — Z711 Person with feared health complaint in whom no diagnosis is made: Secondary | ICD-10-CM

## 2018-02-02 HISTORY — PX: CYSTOSCOPY/RETROGRADE/URETEROSCOPY: SHX5316

## 2018-02-02 HISTORY — PX: CYSTOSCOPY WITH STENT PLACEMENT: SHX5790

## 2018-02-02 LAB — CBC
HEMATOCRIT: 36.6 % (ref 35.0–47.0)
Hemoglobin: 12.4 g/dL (ref 12.0–16.0)
MCH: 33.6 pg (ref 26.0–34.0)
MCHC: 33.8 g/dL (ref 32.0–36.0)
MCV: 99.4 fL (ref 80.0–100.0)
Platelets: 196 10*3/uL (ref 150–440)
RBC: 3.69 MIL/uL — ABNORMAL LOW (ref 3.80–5.20)
RDW: 13.3 % (ref 11.5–14.5)
WBC: 11.3 10*3/uL — AB (ref 3.6–11.0)

## 2018-02-02 LAB — BASIC METABOLIC PANEL
Anion gap: 7 (ref 5–15)
BUN: 25 mg/dL — AB (ref 8–23)
CHLORIDE: 110 mmol/L (ref 98–111)
CO2: 23 mmol/L (ref 22–32)
CREATININE: 0.93 mg/dL (ref 0.44–1.00)
Calcium: 8.4 mg/dL — ABNORMAL LOW (ref 8.9–10.3)
GFR calc Af Amer: >60 mL/min (ref >60)
GFR calc non Af Amer: 57 mL/min — ABNORMAL LOW (ref >60)
GLUCOSE: 160 mg/dL — AB (ref 70–99)
POTASSIUM: 4 mmol/L (ref 3.5–5.1)
SODIUM: 140 mmol/L (ref 135–145)

## 2018-02-02 SURGERY — CYSTOSCOPY/RETROGRADE/URETEROSCOPY
Anesthesia: General | Site: Ureter | Laterality: Left | Wound class: "Clean Contaminated "

## 2018-02-02 MED ORDER — IOTHALAMATE MEGLUMINE 43 % IV SOLN
INTRAVENOUS | Status: DC | PRN
  Administered 2018-02-02: 15 mL via URETHRAL

## 2018-02-02 MED ORDER — HYDROCODONE-ACETAMINOPHEN 7.5-325 MG PO TABS: 1.0000 | ORAL_TABLET | Freq: Once | ORAL | Status: DC | PRN

## 2018-02-02 MED ORDER — SODIUM CHLORIDE 0.9 % IV SOLN
INTRAVENOUS | Status: DC
  Administered 2018-02-02: 11:00:00 via INTRAVENOUS

## 2018-02-02 MED ORDER — KETOROLAC TROMETHAMINE 30 MG/ML IJ SOLN
INTRAMUSCULAR | Status: AC
  Filled 2018-02-02: qty 1

## 2018-02-02 MED ORDER — OXYCODONE HCL 5 MG PO TABS: 5.0000 mg | ORAL_TABLET | ORAL | 0 refills | Status: DC | PRN

## 2018-02-02 MED ORDER — DEXAMETHASONE SODIUM PHOSPHATE 10 MG/ML IJ SOLN
INTRAMUSCULAR | Status: DC | PRN
  Administered 2018-02-02: 5 mg via INTRAVENOUS

## 2018-02-02 MED ORDER — KETOROLAC TROMETHAMINE 30 MG/ML IJ SOLN
INTRAMUSCULAR | Status: DC | PRN
  Administered 2018-02-02: 30 mg via INTRAVENOUS

## 2018-02-02 MED ORDER — PROMETHAZINE HCL 25 MG/ML IJ SOLN: 6.2500 mg | INTRAMUSCULAR | Status: DC | PRN

## 2018-02-02 MED ORDER — MEPERIDINE HCL 50 MG/ML IJ SOLN: 6.2500 mg | INTRAMUSCULAR | Status: DC | PRN

## 2018-02-02 MED ORDER — LIDOCAINE HCL (CARDIAC) PF 100 MG/5ML IV SOSY
PREFILLED_SYRINGE | INTRAVENOUS | Status: DC | PRN
  Administered 2018-02-02: 80 mg via INTRAVENOUS

## 2018-02-02 MED ORDER — FENTANYL CITRATE (PF) 100 MCG/2ML IJ SOLN: 25.0000 ug | INTRAMUSCULAR | Status: DC | PRN

## 2018-02-02 MED ORDER — ROCURONIUM BROMIDE 100 MG/10ML IV SOLN
INTRAVENOUS | Status: DC | PRN
  Administered 2018-02-02: 5 mg via INTRAVENOUS

## 2018-02-02 MED ORDER — PROPOFOL 10 MG/ML IV BOLUS
INTRAVENOUS | Status: DC | PRN
  Administered 2018-02-02: 100 mg via INTRAVENOUS

## 2018-02-02 MED ORDER — ACETAMINOPHEN 325 MG PO TABS: 325.0000 mg | ORAL_TABLET | ORAL | Status: DC | PRN

## 2018-02-02 MED ORDER — ACETAMINOPHEN 160 MG/5ML PO SOLN
325.0000 mg | ORAL | Status: DC | PRN
  Filled 2018-02-02: qty 20.3

## 2018-02-02 MED ORDER — ONDANSETRON HCL 4 MG/2ML IJ SOLN
INTRAMUSCULAR | Status: DC | PRN
  Administered 2018-02-02: 4 mg via INTRAVENOUS

## 2018-02-02 MED ORDER — FENTANYL CITRATE (PF) 100 MCG/2ML IJ SOLN
INTRAMUSCULAR | Status: DC | PRN
  Administered 2018-02-02 (×2): 50 ug via INTRAVENOUS

## 2018-02-02 MED ORDER — SUCCINYLCHOLINE CHLORIDE 20 MG/ML IJ SOLN
INTRAMUSCULAR | Status: DC | PRN
  Administered 2018-02-02: 70 mg via INTRAVENOUS

## 2018-02-02 MED ORDER — PROPOFOL 10 MG/ML IV BOLUS
INTRAVENOUS | Status: AC
  Filled 2018-02-02: qty 20

## 2018-02-02 MED ORDER — ONDANSETRON HCL 4 MG/2ML IJ SOLN
INTRAMUSCULAR | Status: AC
  Filled 2018-02-02: qty 2

## 2018-02-02 MED ORDER — CEFAZOLIN SODIUM-DEXTROSE 2-3 GM-%(50ML) IV SOLR
INTRAVENOUS | Status: DC | PRN
  Administered 2018-02-02: 2 g via INTRAVENOUS

## 2018-02-02 MED ORDER — FENTANYL CITRATE (PF) 100 MCG/2ML IJ SOLN
INTRAMUSCULAR | Status: AC
  Filled 2018-02-02: qty 2

## 2018-02-02 MED ORDER — ACETAMINOPHEN 325 MG PO TABS: 650.0000 mg | ORAL_TABLET | Freq: Four times a day (QID) | ORAL | Status: AC | PRN

## 2018-02-02 MED ORDER — OXYBUTYNIN CHLORIDE 5 MG PO TABS: 5.0000 mg | ORAL_TABLET | Freq: Three times a day (TID) | ORAL | 0 refills | Status: DC | PRN

## 2018-02-02 MED ORDER — DEXAMETHASONE SODIUM PHOSPHATE 10 MG/ML IJ SOLN
INTRAMUSCULAR | Status: AC
  Filled 2018-02-02: qty 1

## 2018-02-02 MED ORDER — TAMSULOSIN HCL 0.4 MG PO CAPS: 0.4000 mg | ORAL_CAPSULE | Freq: Every day | ORAL | 0 refills | Status: DC

## 2018-02-02 MED ORDER — TAMSULOSIN HCL 0.4 MG PO CAPS
0.4000 mg | ORAL_CAPSULE | Freq: Every day | ORAL | Status: DC
  Administered 2018-02-02 (×2): 0.4 mg via ORAL
  Filled 2018-02-02 (×2): qty 1

## 2018-02-02 SURGICAL SUPPLY — 29 items
BAG DRAIN CYSTO-URO LG1000N (MISCELLANEOUS) ×4 IMPLANT
BASKET ZERO TIP 1.9FR (BASKET) IMPLANT
BRUSH SCRUB EZ 1% IODOPHOR (MISCELLANEOUS) ×4 IMPLANT
CATH URETL 5X70 OPEN END (CATHETERS) ×4 IMPLANT
CNTNR SPEC 2.5X3XGRAD LEK (MISCELLANEOUS)
CONRAY 43 FOR UROLOGY 50M (MISCELLANEOUS) ×4 IMPLANT
CONT SPEC 4OZ STER OR WHT (MISCELLANEOUS)
CONTAINER SPEC 2.5X3XGRAD LEK (MISCELLANEOUS) IMPLANT
DRAPE UTILITY 15X26 TOWEL STRL (DRAPES) ×4 IMPLANT
GLOVE BIO SURGEON STRL SZ 6.5 (GLOVE) ×3 IMPLANT
GLOVE BIO SURGEONS STRL SZ 6.5 (GLOVE) ×1
GOWN STRL REUS W/ TWL LRG LVL3 (GOWN DISPOSABLE) ×4 IMPLANT
GOWN STRL REUS W/TWL LRG LVL3 (GOWN DISPOSABLE) ×4
GUIDEWIRE GREEN .038 145CM (MISCELLANEOUS) IMPLANT
INFUSOR MANOMETER BAG 3000ML (MISCELLANEOUS) ×4 IMPLANT
INTRODUCER DILATOR DOUBLE (INTRODUCER) IMPLANT
KIT TURNOVER CYSTO (KITS) ×4 IMPLANT
PACK CYSTO AR (MISCELLANEOUS) ×4 IMPLANT
SENSORWIRE 0.038 NOT ANGLED (WIRE) ×4
SET CYSTO W/LG BORE CLAMP LF (SET/KITS/TRAYS/PACK) ×4 IMPLANT
SHEATH URETERAL 12FRX35CM (MISCELLANEOUS) IMPLANT
SOL .9 NS 3000ML IRR  AL (IV SOLUTION) ×2
SOL .9 NS 3000ML IRR UROMATIC (IV SOLUTION) ×2 IMPLANT
STENT URET 6FRX22 CONTOUR (STENTS) ×3 IMPLANT
STENT URET 6FRX24 CONTOUR (STENTS) IMPLANT
STENT URET 6FRX26 CONTOUR (STENTS) IMPLANT
SURGILUBE 2OZ TUBE FLIPTOP (MISCELLANEOUS) ×4 IMPLANT
WATER STERILE IRR 1000ML POUR (IV SOLUTION) ×4 IMPLANT
WIRE SENSOR 0.038 NOT ANGLED (WIRE) ×2 IMPLANT

## 2018-02-02 NOTE — Plan of Care (Signed)
Resting quietly in bed,calbell within reach.

## 2018-02-02 NOTE — Anesthesia Procedure Notes (Signed)
Procedure Name: Intubation Date/Time: 02/02/2018 2:33 PM Performed by: Jonna Clark, CRNA Pre-anesthesia Checklist: Patient identified, Patient being monitored, Timeout performed, Emergency Drugs available and Suction available Patient Re-evaluated:Patient Re-evaluated prior to induction Oxygen Delivery Method: Circle system utilized Preoxygenation: Pre-oxygenation with 100% oxygen Induction Type: IV induction Ventilation: Mask ventilation without difficulty Laryngoscope Size: Mac and 3 Grade View: Grade II Tube type: Oral Tube size: 7.0 mm Number of attempts: 1 Airway Equipment and Method: Stylet Placement Confirmation: ETT inserted through vocal cords under direct vision,  positive ETCO2 and breath sounds checked- equal and bilateral Secured at: 21 cm Tube secured with: Tape Dental Injury: Teeth and Oropharynx as per pre-operative assessment

## 2018-02-02 NOTE — Anesthesia Post-op Follow-up Note (Signed)
Anesthesia QCDR form completed.        

## 2018-02-02 NOTE — Anesthesia Postprocedure Evaluation (Signed)
Anesthesia Post Note  Patient: Nicole Burnett  Procedure(s) Performed: CYSTOSCOPY/RETROGRADE/URETEROSCOPY (Left Ureter) CYSTOSCOPY WITH STENT PLACEMENT (Left Ureter)  Patient location during evaluation: PACU Anesthesia Type: General Level of consciousness: awake and alert Pain management: pain level controlled Vital Signs Assessment: post-procedure vital signs reviewed and stable Respiratory status: spontaneous breathing, nonlabored ventilation, respiratory function stable and patient connected to nasal cannula oxygen Cardiovascular status: blood pressure returned to baseline and stable Postop Assessment: no apparent nausea or vomiting Anesthetic complications: no     Last Vitals:  Vitals:   02/02/18 1533 02/02/18 1548  BP: (!) 104/50 (!) 99/50  Pulse: 85 85  Resp: 15 14  Temp:  36.8 C  SpO2: 99% 98%    Last Pain:  Vitals:   02/02/18 1548  TempSrc:   PainSc: 0-No pain                 Lenard SimmerAndrew Ellery Tash

## 2018-02-02 NOTE — Care Management Obs Status (Signed)
MEDICARE OBSERVATION STATUS NOTIFICATION   Patient Details  Name: Nicole Burnett MRN: [id number] Date of Birth: 07-03-1940   Medicare Observation Status Notification Given:  No(admitted obs less thand 24 hours)    Chapman FitchBOWEN, Montae Stager T, RN 02/02/2018, 11:42 AM

## 2018-02-02 NOTE — Op Note (Signed)
Date of procedure: 02/02/18  Preoperative diagnosis:  1. Left proximal ureteral calculus 2. Refractory left flank pain  Postoperative diagnosis:  1. Negative ureteroscopy  Procedure: 1. Cystoscopy 2. Left retrograde pyelogram 3. Left diagnostic ureteroscopy 4. Left ureteral stent placement  Surgeon: Nicole ScotlandAshley Celene Pippins, MD  Anesthesia: General  Complications: None  Intraoperative findings: No stone identified on scout.  Subtle kinking of left proximal ureter, question of retained stone and retrograde.  Negative diagnostic ureteroscopy.  EBL: Minimal  Specimens: None  Drains: 6 x 22 French double-J ureteral stent on left, tether left in place  Indication: Nicole Burnett is a 77 y.o. patient with small left proximal ureteral stone with severe refractory pain.  She is been straining her urine all day and not seen a stone pass.  KUB does not definitively show the stone but there is a large amount of bowel gas.  After reviewing the management options for treatment, she elected to proceed with the above surgical procedure(s). We have discussed the potential benefits and risks of the procedure, side effects of the proposed treatment, the likelihood of the patient achieving the goals of the procedure, and any potential problems that might occur during the procedure or recuperation. Informed consent has been obtained.  Description of procedure:  The patient was taken to the operating room and general anesthesia was induced.  The patient was placed in the dorsal lithotomy position, prepped and draped in the usual sterile fashion, and preoperative antibiotics were administered. A preoperative time-out was performed.   A 21 French scope was advanced per urethra into the bladder.  Attention was turned to the left ureteral orifice which cannulated using a 5 [place]French open-ended ureteral catheter.  Gentle retrograde pyelogram was performed which showed a decompressed upper tract collecting system but  there was a small kinking/possible filling defect in the left proximal ureter possibly representing a retained stone.  This is not seen on scout imaging is a calcification.  A sensor wire was then placed up to level the kidney without difficulty.  Of 4.5 French semirigid ureteroscope was advanced alongside the wire up to level the proximal ureter no stone was identified.  The area in question was somewhat higher than this and the scope was not able to pass that high.  As such, second Super Stiff wire was placed up to level the kidney.  A 7 French flexible digital ureteroscope was advanced over this wire easily up to level of the kidney.  Each and every calyx was directly visualized and a second retrograde pyelogram was created to create a roadmap to ensure that all imaging was identified.  No stone was identified.  Upon withdrawing the scope in the area of question, there was a small kink appreciated.  I tried to pass the scope back past this area but was not successful and had replaced the wire and advanced the scope over the wire again.  This area was carefully inspected and no stone was identified.  The remainder of the ureter was unremarkable.  This was a negative ureteroscopy.  As a precaution, 6 x 22 French double-J ureteral stent was placed over the wire up to level the kidney.  The wires partially drawn until focal stone within the renal pelvis.  The wire was then fully withdrawn a full coils noted in the bladder.  The stent string was left affixed to the distal coil.  This was affixed to the patient's left inner thigh using Mastisol and Tegaderm.  She is been clean and  dry, repositioned in supine position, reversed anesthesia, and taken to the PACU in stable condition.  Plan: Patient may be discharged as long as her pain is controlled.  She will remove her own stent on Friday.  She will follow-up in 4 weeks with renal ultrasound prior.  Nicole Burnett, M.D.

## 2018-02-02 NOTE — Consult Note (Signed)
Urology Consult  I have been asked to see the patient by Dr. Anne HahnWillis, for evaluation and management of left ureteral stone.  Chief Complaint: Left flank pain  History of Present Illness: Nicole Burnett is a 77 y.o. year old female who presented to the emergency room yesterday evening with severe acute onset left flank pain.  She had been experiencing intermittent but less severe pain in her left flank over the past 3 days but then suddenly became more severe upon arriving to the emergency room.  She underwent further evaluation with a noncontrast CT scan which indicated a 6 mm left proximal obstructing ureteral stone.  There was no other signs or symptoms of infection and her urinalysis was unremarkable.  She is been afebrile and hemodynamically stable.  She denies any associated urinary symptoms including no dysuria or gross hematuria, frequency or urgency.  No fevers or chills.  No nausea or vomiting.  She was admitted to the hospitalist service for poorly controlled pain.  She is required multiple doses of morphine throughout yesterday evening and this morning.  This morning, her pain is improved but still present.  She denies a personal history of kidney stones.  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  She is been on this medication for at least 10 years.  Past Medical History:  Diagnosis Date  . Anxiety   . Carpal tunnel syndrome   . Dizziness and giddiness   . Generalized convulsive epilepsy without mention of intractable epilepsy   . Memory loss   . Unspecified adverse effect of unspecified drug, medicinal and biological substance     Past Surgical History:  Procedure Laterality Date  . ABDOMINAL HYSTERECTOMY    . TONSILLECTOMY AND ADENOIDECTOMY      Home Medications:  Current Meds  Medication Sig  . acetaminophen (TYLENOL) 500 MG tablet Take 500 mg by mouth every other day.    Marland Kitchen. aspirin 81 MG tablet Take 81 mg by mouth daily.  . brimonidine (ALPHAGAN P) 0.1 % SOLN Place 5 drops into the left eye 2 (two) times daily.   . Diclofenac-Misoprostol (ARTHROTEC) 75-0.2 MG TBEC Take 1 tablet by mouth every other day.   . latanoprost (XALATAN) 0.005 % ophthalmic solution Place 1 drop into both eyes at bedtime.   . Multiple Vitamins-Minerals (CENTRUM SILVER PO) Take 1 tablet by mouth daily.   Bertram Gala. Polyethyl Glycol-Propyl Glycol (SYSTANE) 0.4-0.3 % SOLN Apply 1 application to eye 2 (two) times daily.   Marland Kitchen. zonisamide (ZONEGRAN) 100 MG capsule TAKE 1 CAPSULE BY MOUTH DAILY AND 2 CAPSULES EVERY NIGHT AT BEDTIME (Patient taking differently: Take 100-200 mg by mouth 2 (two) times daily. TAKE 1 CAPSULE BY MOUTH AT LUNCH AND 2 CAPSULES EVERY NIGHT AT BEDTIME)    Allergies:  Allergies  Allergen Reactions  . Bee Venom Anaphylaxis  . Lamotrigine Rash  . Levetiracetam Rash  . Motrin [Ibuprofen] Rash    Family History  Problem Relation Age of Onset  . Mitral valve prolapse Mother   . Heart disease Unknown     Social History:  reports that she has quit smoking. Her smoking use included cigarettes. She has never used smokeless tobacco. She reports that she drinks about 1.0 standard drinks of alcohol per week. She reports that she does not use drugs.  ROS: A complete review of systems was performed.  All systems are negative except for  pertinent findings as noted.  Physical Exam:  Vital signs in last 24 hours: Temp:  [98 F (36.7 C)-98.6 F (37 C)] 98.6 F (37 C) (08/14 0500) Pulse Rate:  [84-88] 84 (08/14 0500) Resp:  [14-20] 20 (08/14 0500) BP: (106-138)/(51-91) 106/51 (08/14 0500) SpO2:  [89 %-100 %] 99 % (08/14 0500) Weight:  [57.6 kg-58.9 kg] 58.9 kg (08/13 2255) Constitutional:  Alert and oriented, No acute distress.  Husband at bedside. HEENT: [place] AT, moist mucus membranes.  Trachea midline, no masses Cardiovascular: Regular rate and rhythm, no clubbing, cyanosis, or  edema. Respiratory: Normal respiratory effort, lungs clear bilaterally GI: Abdomen is soft, nontender, nondistended, no abdominal masses. GU: Mild left CVA tenderness Skin: No rashes, bruises or suspicious lesions Neurologic: Grossly intact, no focal deficits, moving all 4 extremities Psychiatric: Normal mood and affect   Laboratory Data:  Recent Labs    02/01/18 1819 02/02/18 0311  WBC 8.1 11.3*  HGB 14.4 12.4  HCT 43.6 36.6   Recent Labs    02/01/18 1819 02/02/18 0311  NA 140 140  K 4.0 4.0  CL 106 110  CO2 25 23  GLUCOSE 113* 160*  BUN 25* 25*  CREATININE 0.77 0.93  CALCIUM 9.8 8.4*   Component     Latest Ref Rng & Units 02/01/2018  Color, Urine     YELLOW YELLOW (A)  Appearance     CLEAR CLEAR (A)  Specific Gravity, Urine     1.005 - 1.030 1.016  pH     5.0 - 8.0 6.0  Glucose, UA     NEGATIVE mg/dL NEGATIVE  Hgb urine dipstick     NEGATIVE SMALL (A)  Bilirubin Urine     NEGATIVE NEGATIVE  Ketones, ur     NEGATIVE mg/dL NEGATIVE  Protein     NEGATIVE mg/dL NEGATIVE  Nitrite     NEGATIVE NEGATIVE  Leukocytes, UA     NEGATIVE SMALL (A)  RBC / HPF     0 - 5 RBC/hpf 21-50  WBC, UA     0 - 5 WBC/hpf 6-10  Bacteria, UA     NONE SEEN NONE SEEN  Squamous Epithelial / LPF     0 - 5 0-5  Mucus      PRESENT  Hyaline Casts, UA      PRESENT    Radiologic Imaging: Ct Renal Stone Study  Result Date: 02/01/2018 CLINICAL DATA:  Left-sided flank pain for 2 days EXAM: CT ABDOMEN AND PELVIS WITHOUT CONTRAST TECHNIQUE: Multidetector CT imaging of the abdomen and pelvis was performed following the standard protocol without IV contrast. COMPARISON:  None. FINDINGS: Lower chest: No acute abnormality. Hepatobiliary: No focal liver abnormality is seen. No gallstones, gallbladder wall thickening, or biliary dilatation. Pancreas: Unremarkable. No pancreatic ductal dilatation or surrounding inflammatory changes. Spleen: Normal in size without focal abnormality.  Adrenals/Urinary Tract: The adrenal glands are within normal limits bilaterally. Kidneys are well visualized bilaterally. The right kidney is within normal limits with scattered parapelvic cysts identified. The left kidney also demonstrates mild peripelvic cysts although hydronephrosis and proximal hydroureter is noted secondary to a 6 mm proximal left ureteral stone. The more distal left ureter is within normal limits. Tiny left nonobstructing renal calculi are noted. The bladder is decompressed. Stomach/Bowel: Scattered diverticular change of the colon is noted without evidence of diverticulitis. No obstructive or inflammatory changes are seen. The appendix is not well visualized. No inflammatory changes are noted. Hiatal hernia is noted. Vascular/Lymphatic: Aortic atherosclerosis. No enlarged abdominal  or pelvic lymph nodes. Reproductive: Status post hysterectomy. No adnexal masses. Other: No abdominal wall hernia or abnormality. No abdominopelvic ascites. Musculoskeletal: Degenerative changes are noted. IMPRESSION: Left proximal ureteral stone with obstructive change. Tiny nonobstructing left renal calculi. Chronic changes as described above. Electronically Signed   By: Alcide CleverMark  Lukens M.D.   On: 02/01/2018 20:59   CT scan personally reviewed.  Agree with radiologic interpretation.  Impression/Assessment:  77 year old female with an obstructing 6 mm left proximal ureteral stone and poorly controlled pain.  No signs or symptoms of infection.  Plan:  Lengthy discussion today with the patient regarding the size and location of her stone.  She does have a fairly decent chance of passing the stone with medical expulsive therapy over the next 30 days, however, given that her pain is poorly controlled and she is currently an inpatient requiring IV morphine, this does not seem like a reasonable approach.  We discussed the option of continued expulsive therapy for several days in the hospital with narcotics to  control her pain versus going ahead in pursuing surgical intervention.  She was given Lovenox yesterday this is not a candidate for shockwave lithotripsy.  We discussed the option of ureteroscopy with laser lithotripsy and stent today at length.  Risks and benefits of ureteroscopy were reviewed including but not limited to infection, bleeding, pain, ureteral injury which could require open surgery versus prolonged indwelling if ureteral perforation occurs, persistent stone disease, requirement for staged procedure, possible stent, and global anesthesia risks.  At this point in time, we will plan to repeat her KUB around noon.  She will remain n.p.o.  If her stone is still present at that time, she would like to pursue surgical intervention.  All questions answered.  If she does well postoperatively, because she could be discharged later today after her procedure.  02/02/2018, 8:10 AM  Vanna ScotlandAshley Jorryn Hershberger,  MD

## 2018-02-02 NOTE — Discharge Summary (Signed)
[facility] - [place] at [facility]   PATIENT NAME: Nicole Burnett    MR#:  [id number]  DATE OF BIRTH:  03-24-1941  DATE OF ADMISSION:  02/01/2018 ADMITTING PHYSICIAN: Oralia Manis, MD  DATE OF DISCHARGE: 02/02/18    PRIMARY CARE PHYSICIAN: Jaclyn Shaggy, MD    ADMISSION DIAGNOSIS:  Kidney stone on left side [N20.0] Intractable pain [R52]  DISCHARGE DIAGNOSIS:  Principal Problem:   Kidney stone on left side Active Problems:   Anxiety   Seizure disorder (HCC)   Intractable pain   SECONDARY DIAGNOSIS:   Past Medical History:  Diagnosis Date  . Anxiety   . Carpal tunnel syndrome   . Dizziness and giddiness   . Generalized convulsive epilepsy without mention of intractable epilepsy   . Memory loss   . Unspecified adverse effect of unspecified drug, medicinal and biological substance     HOSPITAL COURSE:   HPI  Nicole Burnett  is a 77 y.o. female who presents with chief complaint as above.  1 day of left flank and abdominal discomfort.  Patient states this discomfort would come and sharp waves and then improved.  Initially yesterday it was not very aggressive, but it woke her up through the night, then this afternoon it became much more intense.  She came to the ED for evaluation and was found to have a left 6 mm kidney stone.  There are no signs of infection, and hospitalist were called for admission for pain control and supportive treatment  Hospital course #Acute flank pain secondary to left left ureteral stone Pain is well controlled with IV fluids and pain medications. Seen by urologist recommending ureteroscopy and stent placement Okay to discharge patient from urology standpoint if clinically stable after procedure , patient is agreeable Discharge home with p.o. tramadol for pain Out patient follow-up with urology in 10 to 14 days  # anxiety-currently doing fine.  Not on any medications  #Seizure disorder-outpatient follow-up with neurology  as recommended  DISCHARGE CONDITIONS:   fair  CONSULTS OBTAINED:  Treatment Team:  Vanna Scotland, MD   PROCEDURES ureteroscopy with laser lithotripsy and stent 02/02/18  DRUG ALLERGIES:   Allergies  Allergen Reactions  . Bee Venom Anaphylaxis  . Lamotrigine Rash  . Levetiracetam Rash  . Motrin [Ibuprofen] Rash    DISCHARGE MEDICATIONS:   Allergies as of 02/02/2018      Reactions   Bee Venom Anaphylaxis   Lamotrigine Rash   Levetiracetam Rash   Motrin [ibuprofen] Rash      Medication List    TAKE these medications   acetaminophen 325 MG tablet Commonly known as:  TYLENOL Take 2 tablets (650 mg total) by mouth every 6 (six) hours as needed for mild pain (or Fever >/= 101). What changed:    medication strength  how much to take  when to take this  reasons to take this   ALPHAGAN P 0.1 % Soln Generic drug:  brimonidine Place 5 drops into the left eye 2 (two) times daily.   ARTHROTEC 75-0.2 MG Tbec Generic drug:  Diclofenac-miSOPROStol Take 1 tablet by mouth every other day.   aspirin 81 MG tablet Take 81 mg by mouth daily.   CENTRUM SILVER PO Take 1 tablet by mouth daily.   latanoprost 0.005 % ophthalmic solution Commonly known as:  XALATAN Place 1 drop into both eyes at bedtime.   oxyCODONE 5 MG immediate release tablet Commonly known as:  Oxy IR/ROXICODONE Take 1 tablet (5 mg total)  by mouth every 4 (four) hours as needed for moderate pain.   SYSTANE 0.4-0.3 % Soln Generic drug:  Polyethyl Glycol-Propyl Glycol Apply 1 application to eye 2 (two) times daily.   tamsulosin 0.4 MG Caps capsule Commonly known as:  FLOMAX Take 1 capsule (0.4 mg total) by mouth daily. Start taking on:  02/03/2018   zonisamide 100 MG capsule Commonly known as:  ZONEGRAN TAKE 1 CAPSULE BY MOUTH DAILY AND 2 CAPSULES EVERY NIGHT AT BEDTIME What changed:  See the new instructions.        DISCHARGE INSTRUCTIONS:   Follow-up with primary care physician in 3  days Follow-up with urology in 10 to 14 days or sooner as needed  DIET:  Regular diet  DISCHARGE CONDITION:  Fair  ACTIVITY:  Activity as tolerated  OXYGEN:  Home Oxygen: No.   Oxygen Delivery: room air  DISCHARGE LOCATION:  home   If you experience worsening of your admission symptoms, develop shortness of breath, life threatening emergency, suicidal or homicidal thoughts you must seek medical attention immediately by calling 911 or calling your MD immediately  if symptoms less severe.  You Must read complete instructions/literature along with all the possible adverse reactions/side effects for all the Medicines you take and that have been prescribed to you. Take any new Medicines after you have completely understood and accpet all the possible adverse reactions/side effects.   Please note  You were cared for by a hospitalist during your hospital stay. If you have any questions about your discharge medications or the care you received while you were in the hospital after you are discharged, you can call the unit and asked to speak with the hospitalist on call if the hospitalist that took care of you is not available. Once you are discharged, your primary care physician will handle any further medical issues. Please note that NO REFILLS for any discharge medications will be authorized once you are discharged, as it is imperative that you return to your primary care physician (or establish a relationship with a primary care physician if you do not have one) for your aftercare needs so that they can reassess your need for medications and monitor your lab values.     Today  Chief Complaint  Patient presents with  . Flank Pain   Patient is feeling much better.  Denies any flank pain today denies any nausea or vomiting.  Really want to go home after the procedure Okay to discharge patient from urology standpoint after the procedure  ROS:  CONSTITUTIONAL: Denies fevers, chills.  Denies any fatigue, weakness.  EYES: Denies blurry vision, double vision, eye pain. EARS, NOSE, THROAT: Denies tinnitus, ear pain, hearing loss. RESPIRATORY: Denies cough, wheeze, shortness of breath.  CARDIOVASCULAR: Denies chest pain, palpitations, edema.  GASTROINTESTINAL: Denies nausea, vomiting, diarrhea, abdominal pain. Denies bright red blood per rectum. GENITOURINARY: Denies dysuria, hematuria. ENDOCRINE: Denies nocturia or thyroid problems. HEMATOLOGIC AND LYMPHATIC: Denies easy bruising or bleeding. SKIN: Denies rash or lesion. MUSCULOSKELETAL: Denies pain in neck, back, shoulder, knees, hips or arthritic symptoms.  NEUROLOGIC: Denies paralysis, paresthesias.  PSYCHIATRIC: Denies anxiety or depressive symptoms.   VITAL SIGNS:  Blood pressure (!) 96/49, pulse 89, temperature 98.2 F (36.8 C), temperature source Oral, resp. rate 16, height 5\' 4"  (1.626 m), weight 58.9 kg, SpO2 99 %.  I/O:    Intake/Output Summary (Last 24 hours) at 02/02/2018 1318 Last data filed at 02/02/2018 1003 Gross per 24 hour  Intake 1517.21 ml  Output 600 ml  Net 917.21 ml    PHYSICAL EXAMINATION:  GENERAL:  77 y.o.-year-old patient lying in the bed with no acute distress.  EYES: Pupils equal, round, reactive to light and accommodation. No scleral icterus. Extraocular muscles intact.  HEENT: Head atraumatic, normocephalic. Oropharynx and nasopharynx clear.  NECK:  Supple, no jugular venous distention. No thyroid enlargement, no tenderness.  LUNGS: Normal breath sounds bilaterally, no wheezing, rales,rhonchi or crepitation. No use of accessory muscles of respiration.  CARDIOVASCULAR: S1, S2 normal. No murmurs, rubs, or gallops.  ABDOMEN: Soft, non-tender, non-distended. Bowel sounds present. No organomegaly or mass.  EXTREMITIES: No pedal edema, cyanosis, or clubbing.  NEUROLOGIC: Cranial nerves II through XII are intact. Muscle strength 5/5 in all extremities. Sensation intact. Gait not checked.   PSYCHIATRIC: The patient is alert and oriented x 3.  SKIN: No obvious rash, lesion, or ulcer.   DATA REVIEW:   CBC Recent Labs  Lab 02/02/18 0311  WBC 11.3*  HGB 12.4  HCT 36.6  PLT 196    Chemistries  Recent Labs  Lab 02/02/18 0311  NA 140  K 4.0  CL 110  CO2 23  GLUCOSE 160*  BUN 25*  CREATININE 0.93  CALCIUM 8.4*    Cardiac Enzymes No results for input(s): TROPONINI in the last 168 hours.  Microbiology Results  No results found for this or any previous visit.  RADIOLOGY:  Dg Abd 1 View  Result Date: 02/02/2018 CLINICAL DATA:  Left flank pain and kidney stone EXAM: ABDOMEN - 1 VIEW COMPARISON:  CT from yesterday FINDINGS: Limited in evaluating the ureters due to extent of small bowel gas. Left upper ureteral stone seen on prior is no longer visualized. There are pelvic calcifications consistent with phleboliths based on prior CT. Normal bowel gas pattern. Lumbar spine degeneration and mild dextrocurvature. IMPRESSION: Left ureteral stone by CT yesterday is not clearly visualized on this study. Electronically Signed   By: Marnee SpringJonathon  Watts M.D.   On: 02/02/2018 12:36   Ct Renal Stone Study  Result Date: 02/01/2018 CLINICAL DATA:  Left-sided flank pain for 2 days EXAM: CT ABDOMEN AND PELVIS WITHOUT CONTRAST TECHNIQUE: Multidetector CT imaging of the abdomen and pelvis was performed following the standard protocol without IV contrast. COMPARISON:  None. FINDINGS: Lower chest: No acute abnormality. Hepatobiliary: No focal liver abnormality is seen. No gallstones, gallbladder wall thickening, or biliary dilatation. Pancreas: Unremarkable. No pancreatic ductal dilatation or surrounding inflammatory changes. Spleen: Normal in size without focal abnormality. Adrenals/Urinary Tract: The adrenal glands are within normal limits bilaterally. Kidneys are well visualized bilaterally. The right kidney is within normal limits with scattered parapelvic cysts identified. The left kidney  also demonstrates mild peripelvic cysts although hydronephrosis and proximal hydroureter is noted secondary to a 6 mm proximal left ureteral stone. The more distal left ureter is within normal limits. Tiny left nonobstructing renal calculi are noted. The bladder is decompressed. Stomach/Bowel: Scattered diverticular change of the colon is noted without evidence of diverticulitis. No obstructive or inflammatory changes are seen. The appendix is not well visualized. No inflammatory changes are noted. Hiatal hernia is noted. Vascular/Lymphatic: Aortic atherosclerosis. No enlarged abdominal or pelvic lymph nodes. Reproductive: Status post hysterectomy. No adnexal masses. Other: No abdominal wall hernia or abnormality. No abdominopelvic ascites. Musculoskeletal: Degenerative changes are noted. IMPRESSION: Left proximal ureteral stone with obstructive change. Tiny nonobstructing left renal calculi. Chronic changes as described above. Electronically Signed   By: Alcide CleverMark  Lukens M.D.   On: 02/01/2018 20:59    EKG:  Orders placed or performed in visit on 07/04/07  . EKG 12-Lead      Management plans discussed with the patient, family and they are in agreement.  CODE STATUS:     Code Status Orders  (From admission, onward)         Start     Ordered   02/01/18 2251  Full code  Continuous     02/01/18 2250        Code Status History    This patient has a current code status but no historical code status.    Advance Directive Documentation     Most Recent Value  Type of Advance Directive  Living will, Healthcare Power of Attorney  Pre-existing out of facility DNR order (yellow form or pink MOST form)  -  "MOST" Form in Place?  -      TOTAL TIME TAKING CARE OF THIS PATIENT: 45 minutes.   Note: This dictation was prepared with Dragon dictation along with smaller phrase technology. Any transcriptional errors that result from this process are unintentional.   @M[facility] @  on 02/02/2018 at 1:18  PM  Between 7am to 6pm - Pager - [phone]  After 6pm go to [email or web address] - password [email or web address] [facility]  [facility]  Office  [phone]  CC: Primary care physician; Jaclyn Shaggy, MD

## 2018-02-02 NOTE — Progress Notes (Signed)
Pt discharged per MD order. IVs removed. Discharge instructions reviewed with pt. Pt verbalized understanding and all questions answered to pt satisfaction. Prescriptions given to pt. Pt taken to car in wheelchair by staff.

## 2018-02-02 NOTE — H&P (View-Only) (Signed)
   Urology Consult  I have been asked to see the patient by Dr. Willis, for evaluation and management of left ureteral stone.  Chief Complaint: Left flank pain  History of Present Illness: Nicole Burnett is a 77 y.o. year old female who presented to the emergency room yesterday evening with severe acute onset left flank pain.  She had been experiencing intermittent but less severe pain in her left flank over the past 3 days but then suddenly became more severe upon arriving to the emergency room.  She underwent further evaluation with a noncontrast CT scan which indicated a 6 mm left proximal obstructing ureteral stone.  There was no other signs or symptoms of infection and her urinalysis was unremarkable.  She is been afebrile and hemodynamically stable.  She denies any associated urinary symptoms including no dysuria or gross hematuria, frequency or urgency.  No fevers or chills.  No nausea or vomiting.  She was admitted to the hospitalist service for poorly controlled pain.  She is required multiple doses of morphine throughout yesterday evening and this morning.  This morning, her pain is improved but still present.  She denies a personal history of kidney stones.  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  She is been on this medication for at least 10 years.  Past Medical History:  Diagnosis Date  . Anxiety   . Carpal tunnel syndrome   . Dizziness and giddiness   . Generalized convulsive epilepsy without mention of intractable epilepsy   . Memory loss   . Unspecified adverse effect of unspecified drug, medicinal and biological substance     Past Surgical History:  Procedure Laterality Date  . ABDOMINAL HYSTERECTOMY    . TONSILLECTOMY AND ADENOIDECTOMY      Home Medications:  Current Meds  Medication Sig  . acetaminophen (TYLENOL) 500 MG tablet Take 500 mg by mouth every other day.    . aspirin 81 MG tablet Take 81 mg by mouth daily.  . brimonidine (ALPHAGAN P) 0.1 % SOLN Place 5 drops into the left eye 2 (two) times daily.   . Diclofenac-Misoprostol (ARTHROTEC) 75-0.2 MG TBEC Take 1 tablet by mouth every other day.   . latanoprost (XALATAN) 0.005 % ophthalmic solution Place 1 drop into both eyes at bedtime.   . Multiple Vitamins-Minerals (CENTRUM SILVER PO) Take 1 tablet by mouth daily.   . Polyethyl Glycol-Propyl Glycol (SYSTANE) 0.4-0.3 % SOLN Apply 1 application to eye 2 (two) times daily.   . zonisamide (ZONEGRAN) 100 MG capsule TAKE 1 CAPSULE BY MOUTH DAILY AND 2 CAPSULES EVERY NIGHT AT BEDTIME (Patient taking differently: Take 100-200 mg by mouth 2 (two) times daily. TAKE 1 CAPSULE BY MOUTH AT LUNCH AND 2 CAPSULES EVERY NIGHT AT BEDTIME)    Allergies:  Allergies  Allergen Reactions  . Bee Venom Anaphylaxis  . Lamotrigine Rash  . Levetiracetam Rash  . Motrin [Ibuprofen] Rash    Family History  Problem Relation Age of Onset  . Mitral valve prolapse Mother   . Heart disease Unknown     Social History:  reports that she has quit smoking. Her smoking use included cigarettes. She has never used smokeless tobacco. She reports that she drinks about 1.0 standard drinks of alcohol per week. She reports that she does not use drugs.  ROS: A complete review of systems was performed.  All systems are negative except for   pertinent findings as noted.  Physical Exam:  Vital signs in last 24 hours: Temp:  [98 F (36.7 C)-98.6 F (37 C)] 98.6 F (37 C) (08/14 0500) Pulse Rate:  [84-88] 84 (08/14 0500) Resp:  [14-20] 20 (08/14 0500) BP: (106-138)/(51-91) 106/51 (08/14 0500) SpO2:  [89 %-100 %] 99 % (08/14 0500) Weight:  [57.6 kg-58.9 kg] 58.9 kg (08/13 2255) Constitutional:  Alert and oriented, No acute distress.  Husband at bedside. HEENT: [place] AT, moist mucus membranes.  Trachea midline, no masses Cardiovascular: Regular rate and rhythm, no clubbing, cyanosis, or  edema. Respiratory: Normal respiratory effort, lungs clear bilaterally GI: Abdomen is soft, nontender, nondistended, no abdominal masses. GU: Mild left CVA tenderness Skin: No rashes, bruises or suspicious lesions Neurologic: Grossly intact, no focal deficits, moving all 4 extremities Psychiatric: Normal mood and affect   Laboratory Data:  Recent Labs    02/01/18 1819 02/02/18 0311  WBC 8.1 11.3*  HGB 14.4 12.4  HCT 43.6 36.6   Recent Labs    02/01/18 1819 02/02/18 0311  NA 140 140  K 4.0 4.0  CL 106 110  CO2 25 23  GLUCOSE 113* 160*  BUN 25* 25*  CREATININE 0.77 0.93  CALCIUM 9.8 8.4*   Component     Latest Ref Rng & Units 02/01/2018  Color, Urine     YELLOW YELLOW (A)  Appearance     CLEAR CLEAR (A)  Specific Gravity, Urine     1.005 - 1.030 1.016  pH     5.0 - 8.0 6.0  Glucose, UA     NEGATIVE mg/dL NEGATIVE  Hgb urine dipstick     NEGATIVE SMALL (A)  Bilirubin Urine     NEGATIVE NEGATIVE  Ketones, ur     NEGATIVE mg/dL NEGATIVE  Protein     NEGATIVE mg/dL NEGATIVE  Nitrite     NEGATIVE NEGATIVE  Leukocytes, UA     NEGATIVE SMALL (A)  RBC / HPF     0 - 5 RBC/hpf 21-50  WBC, UA     0 - 5 WBC/hpf 6-10  Bacteria, UA     NONE SEEN NONE SEEN  Squamous Epithelial / LPF     0 - 5 0-5  Mucus      PRESENT  Hyaline Casts, UA      PRESENT    Radiologic Imaging: Ct Renal Stone Study  Result Date: 02/01/2018 CLINICAL DATA:  Left-sided flank pain for 2 days EXAM: CT ABDOMEN AND PELVIS WITHOUT CONTRAST TECHNIQUE: Multidetector CT imaging of the abdomen and pelvis was performed following the standard protocol without IV contrast. COMPARISON:  None. FINDINGS: Lower chest: No acute abnormality. Hepatobiliary: No focal liver abnormality is seen. No gallstones, gallbladder wall thickening, or biliary dilatation. Pancreas: Unremarkable. No pancreatic ductal dilatation or surrounding inflammatory changes. Spleen: Normal in size without focal abnormality.  Adrenals/Urinary Tract: The adrenal glands are within normal limits bilaterally. Kidneys are well visualized bilaterally. The right kidney is within normal limits with scattered parapelvic cysts identified. The left kidney also demonstrates mild peripelvic cysts although hydronephrosis and proximal hydroureter is noted secondary to a 6 mm proximal left ureteral stone. The more distal left ureter is within normal limits. Tiny left nonobstructing renal calculi are noted. The bladder is decompressed. Stomach/Bowel: Scattered diverticular change of the colon is noted without evidence of diverticulitis. No obstructive or inflammatory changes are seen. The appendix is not well visualized. No inflammatory changes are noted. Hiatal hernia is noted. Vascular/Lymphatic: Aortic atherosclerosis. No enlarged abdominal   or pelvic lymph nodes. Reproductive: Status post hysterectomy. No adnexal masses. Other: No abdominal wall hernia or abnormality. No abdominopelvic ascites. Musculoskeletal: Degenerative changes are noted. IMPRESSION: Left proximal ureteral stone with obstructive change. Tiny nonobstructing left renal calculi. Chronic changes as described above. Electronically Signed   By: Mark  Lukens M.D.   On: 02/01/2018 20:59   CT scan personally reviewed.  Agree with radiologic interpretation.  Impression/Assessment:  77-year-old female with an obstructing 6 mm left proximal ureteral stone and poorly controlled pain.  No signs or symptoms of infection.  Plan:  Lengthy discussion today with the patient regarding the size and location of her stone.  She does have a fairly decent chance of passing the stone with medical expulsive therapy over the next 30 days, however, given that her pain is poorly controlled and she is currently an inpatient requiring IV morphine, this does not seem like a reasonable approach.  We discussed the option of continued expulsive therapy for several days in the hospital with narcotics to  control her pain versus going ahead in pursuing surgical intervention.  She was given Lovenox yesterday this is not a candidate for shockwave lithotripsy.  We discussed the option of ureteroscopy with laser lithotripsy and stent today at length.  Risks and benefits of ureteroscopy were reviewed including but not limited to infection, bleeding, pain, ureteral injury which could require open surgery versus prolonged indwelling if ureteral perforation occurs, persistent stone disease, requirement for staged procedure, possible stent, and global anesthesia risks.  At this point in time, we will plan to repeat her KUB around noon.  She will remain n.p.o.  If her stone is still present at that time, she would like to pursue surgical intervention.  All questions answered.  If she does well postoperatively, because she could be discharged later today after her procedure.  02/02/2018, 8:10 AM  Laresa Oshiro,  MD       

## 2018-02-02 NOTE — Interval H&P Note (Signed)
History and Physical Interval Note:  02/02/2018 2:10 PM  Nicole OvensNancy L Governale  has presented today for surgery, with the diagnosis of n/a  The various methods of treatment have been discussed with the patient and family. After consideration of risks, benefits and other options for treatment, the patient has consented to  Procedure(s): CYSTOSCOPY/URETEROSCOPY/HOLMIUM LASER/STENT PLACEMENT (Left) as a surgical intervention .  The patient's history has been reviewed, patient examined, no change in status, stable for surgery.  I have reviewed the patient's chart and labs.  Questions were answered to the patient's satisfaction.    RRR CTAB  Vanna ScotlandAshley Alsie Younes

## 2018-02-02 NOTE — Discharge Instructions (Signed)
You have a ureteral stent in place.  This is a tube that extends from your kidney to your bladder.  This may cause urinary bleeding, burning with urination, and urinary frequency.  Please call our office or present to the ED if you develop fevers >101 or pain which is not able to be controlled with oral pain medications.  You may be given either Flomax and/ or ditropan to help with bladder spasms and stent pain in addition to pain medications.    You have a stent on a string.  On Friday morning, you can untaped the stent and pulled gently until the entire stent is removed.  If you have any difficulties, please call our office.  If the stent is dislodged prematurely, please let us know during office hours.  This is not an emergency.  [facility] [address], [address] [place], [place] [address] [phone]

## 2018-02-02 NOTE — Anesthesia Preprocedure Evaluation (Addendum)
Anesthesia Evaluation  Patient identified by MRN, date of birth, ID band Patient awake    Reviewed: Allergy & Precautions, H&P , NPO status , reviewed documented beta blocker date and time   Airway Mallampati: II  TM Distance: >3 FB Neck ROM: full    Dental  (+) Chipped, Caps   Pulmonary former smoker,    Pulmonary exam normal        Cardiovascular Normal cardiovascular exam     Neuro/Psych Seizures -, Well Controlled,  Anxiety No seizures 8+ yrs on Rx  Neuromuscular disease    GI/Hepatic neg GERD  ,  Endo/Other    Renal/GU Renal disease     Musculoskeletal   Abdominal   Peds  Hematology   Anesthesia Other Findings Past Medical History: No date: Anxiety No date: Carpal tunnel syndrome No date: Dizziness and giddiness No date: Generalized convulsive epilepsy without mention of  intractable epilepsy No date: Memory loss No date: Unspecified adverse effect of unspecified drug, medicinal  and biological substance Past Surgical History: No date: ABDOMINAL HYSTERECTOMY No date: TONSILLECTOMY AND ADENOIDECTOMY BMI    Body Mass Index:  22.29 kg/m     Reproductive/Obstetrics                            Anesthesia Physical Anesthesia Plan  ASA: III  Anesthesia Plan: General ETT   Post-op Pain Management:    Induction: Intravenous  PONV Risk Score and Plan: 4 or greater and Ondansetron, Treatment may vary due to age or medical condition, Metaclopromide, Dexamethasone and Midazolam  Airway Management Planned: Oral ETT  Additional Equipment:   Intra-op Plan:   Post-operative Plan: Extubation in OR  Informed Consent: I have reviewed the patients History and Physical, chart, labs and discussed the procedure including the risks, benefits and alternatives for the proposed anesthesia with the patient or authorized representative who has indicated his/her understanding and acceptance.    Dental Advisory Given  Plan Discussed with: CRNA  Anesthesia Plan Comments:        Anesthesia Quick Evaluation

## 2018-02-02 NOTE — Transfer of Care (Signed)
Immediate Anesthesia Transfer of Care Note  Patient: Nicole Burnett  Procedure(s) Performed: CYSTOSCOPY/RETROGRADE/URETEROSCOPY (Left Ureter) CYSTOSCOPY WITH STENT PLACEMENT (Left Ureter)  Patient Location: PACU  Anesthesia Type:General  Level of Consciousness: awake, alert  and oriented  Airway & Oxygen Therapy: Patient Spontanous Breathing and Patient connected to face mask oxygen  Post-op Assessment: Report given to RN and Post -op Vital signs reviewed and stable  Post vital signs: Reviewed and stable  Last Vitals:  Vitals Value Taken Time  BP 94/48 02/02/2018  3:04 PM  Temp 37.1 C 02/02/2018  3:03 PM  Pulse 101 02/02/2018  3:07 PM  Resp 14 02/02/2018  3:07 PM  SpO2 100 % 02/02/2018  3:07 PM  Vitals shown include unvalidated device data.  Last Pain:  Vitals:   02/02/18 1503  TempSrc:   PainSc: Asleep         Complications: No apparent anesthesia complications

## 2018-02-03 ENCOUNTER — Telehealth: Payer: Self-pay | Admitting: Urology

## 2018-02-03 ENCOUNTER — Encounter: Payer: Self-pay | Admitting: Urology

## 2018-02-03 NOTE — Telephone Encounter (Signed)
App made ° ° °Michelle  °

## 2018-02-03 NOTE — Telephone Encounter (Signed)
-----   Message from Vanna ScotlandAshley Brandon, MD sent at 02/02/2018  4:20 PM EDT ----- Regarding: f/u plan This patient is going to be discharged from the hospital today.  She should follow-up in 4 weeks with renal ultrasound prior.  The order was placed.  Vanna ScotlandAshley Brandon, MD

## 2018-02-04 NOTE — Addendum Note (Signed)
Addendum  created 02/04/18 1331 by Stormy Fabianurtis, Aeryn Medici, CRNA   Charge Capture section accepted

## 2018-03-04 ENCOUNTER — Ambulatory Visit
Admission: RE | Admit: 2018-03-04 | Discharge: 2018-03-04 | Disposition: A | Discharge status: Home / Self Care | Payer: Medicare HMO | Source: Ambulatory Visit | Attending: Urology | Admitting: Urology

## 2018-03-04 DIAGNOSIS — N200 Calculus of kidney: Secondary | ICD-10-CM | POA: Insufficient documentation

## 2018-03-15 ENCOUNTER — Encounter: Payer: Self-pay | Admitting: Urology

## 2018-03-15 ENCOUNTER — Ambulatory Visit (INDEPENDENT_AMBULATORY_CARE_PROVIDER_SITE_OTHER): Payer: Medicare HMO | Admitting: Urology

## 2018-03-15 VITALS — BP 138/69 | HR 87 | Ht 64.0 in | Wt 125.0 lb

## 2018-03-15 DIAGNOSIS — Z87442 Personal history of urinary calculi: Secondary | ICD-10-CM

## 2018-03-15 DIAGNOSIS — N281 Cyst of kidney, acquired: Secondary | ICD-10-CM

## 2018-03-15 NOTE — Progress Notes (Signed)
03/15/2018 5:10 PM   Roderic Ovens September 20, 1940 [id number]  Referring provider: Jaclyn Shaggy, MD [address]   [place], [place] [address]  Chief Complaint  Patient presents with  . Nephrolithiasis  . Results    HPI: 77 year old female with a history of obstructing left proximal ureteral calculus on 02/02/2018 during hospital admission for poorly controlled left flank pain.  Ureteroscopy is negative.  She did have a stent on tether which was removed several days.  She follows up today with a renal ultrasound which shows no residual hydronephrosis or kidney stones.  There was an incidental mildly complicated septated cyst noted in the parapelvic region which was also appreciated on the noncontrast CT scan measuring 3.2 cm in diameter.  She does report that she has some mild all residual left flank pain until last week but now this is completely resolved.  No urinary symptoms.  No gross hematuria or dysuria.  She does have a personal history of kidney stones.  Stone composition unknown.   PMH: Past Medical History:  Diagnosis Date  . Anxiety   . Carpal tunnel syndrome   . Dizziness and giddiness   . Generalized convulsive epilepsy without mention of intractable epilepsy   . Memory loss   . Unspecified adverse effect of unspecified drug, medicinal and biological substance     Surgical History: Past Surgical History:  Procedure Laterality Date  . ABDOMINAL HYSTERECTOMY    . CYSTOSCOPY WITH STENT PLACEMENT Left 02/02/2018   Procedure: CYSTOSCOPY WITH STENT PLACEMENT;  Surgeon: Vanna Scotland, MD;  Location: [facility] ORS;  Service: Urology;  Laterality: Left;  . CYSTOSCOPY/RETROGRADE/URETEROSCOPY Left 02/02/2018   Procedure: CYSTOSCOPY/RETROGRADE/URETEROSCOPY;  Surgeon: Vanna Scotland, MD;  Location: [facility];  Service: Urology;  Laterality: Left;  . TONSILLECTOMY AND ADENOIDECTOMY      Home Medications:  Allergies as of 03/15/2018      Reactions   Bee Venom Anaphylaxis     Lamotrigine Rash   Levetiracetam Rash   Motrin [ibuprofen] Rash      Medication List        Accurate as of 03/15/18  5:10 PM. Always use your most recent med list.          acetaminophen 325 MG tablet Commonly known as:  TYLENOL Take 2 tablets (650 mg total) by mouth every 6 (six) hours as needed for mild pain (or Fever >/= 101).   ALPHAGAN P 0.1 % Soln Generic drug:  brimonidine Place 5 drops into the left eye 2 (two) times daily.   ARTHROTEC 75-0.2 MG Tbec Generic drug:  Diclofenac-miSOPROStol Take 1 tablet by mouth every other day.   aspirin 81 MG tablet Take 81 mg by mouth daily.   CENTRUM SILVER PO Take 1 tablet by mouth daily.   latanoprost 0.005 % ophthalmic solution Commonly known as:  XALATAN Place 1 drop into both eyes at bedtime.   SYSTANE 0.4-0.3 % Soln Generic drug:  Polyethyl Glycol-Propyl Glycol Apply 1 application to eye 2 (two) times daily.   zonisamide 100 MG capsule Commonly known as:  ZONEGRAN TAKE 1 CAPSULE BY MOUTH DAILY AND 2 CAPSULES EVERY NIGHT AT BEDTIME       Allergies:  Allergies  Allergen Reactions  . Bee Venom Anaphylaxis  . Lamotrigine Rash  . Levetiracetam Rash  . Motrin [Ibuprofen] Rash    Family History: Family History  Problem Relation Age of Onset  . Mitral valve prolapse Mother   . Heart disease Unknown     Social  History:  reports that she has quit smoking. Her smoking use included cigarettes. She has never used smokeless tobacco. She reports that she drinks about 1.0 standard drinks of alcohol per week. She reports that she does not use drugs.  ROS: UROLOGY Frequent Urination?: No Hard to postpone urination?: No Burning/pain with urination?: No Get up at night to urinate?: No Leakage of urine?: No Urine stream starts and stops?: No Trouble starting stream?: No Do you have to strain to urinate?: No Blood in urine?: No Urinary tract infection?: No Sexually transmitted disease?: No Injury to kidneys or  bladder?: No Painful intercourse?: No Weak stream?: No Currently pregnant?: No Vaginal bleeding?: No Last menstrual period?: n  Gastrointestinal Nausea?: No Vomiting?: No Indigestion/heartburn?: No Diarrhea?: No Constipation?: No  Constitutional Fever: No Night sweats?: No Weight loss?: No Fatigue?: No  Skin Skin rash/lesions?: No Itching?: No  Eyes Blurred vision?: No Double vision?: No  Ears/Nose/Throat Sore throat?: No Sinus problems?: No  Hematologic/Lymphatic Swollen glands?: No Easy bruising?: No  Cardiovascular Leg swelling?: No Chest pain?: No  Respiratory Cough?: No Shortness of breath?: No  Endocrine Excessive thirst?: No  Musculoskeletal Back pain?: Yes Joint pain?: No  Neurological Headaches?: No Dizziness?: No  Psychologic Depression?: No Anxiety?: Yes  Physical Exam: BP 138/69   Pulse 87   Ht 5\' 4"  (1.626 m)   Wt 125 lb (56.7 kg)   BMI 21.46 kg/m   Constitutional:  Alert and oriented, No acute distress. HEENT: [place] AT, moist mucus membranes.  Trachea midline, no masses. Cardiovascular: No clubbing, cyanosis, or edema. Respiratory: Normal respiratory effort, no increased work of breathing. GI: Abdomen is soft, nontender, nondistended, no abdominal masses GU: No CVA tenderness Skin: No rashes, bruises or suspicious lesions. Neurologic: Grossly intact, no focal deficits, moving all 4 extremities. Psychiatric: Normal mood and affect.  Laboratory Data: Lab Results  Component Value Date   WBC 11.3 (H) 02/02/2018   HGB 12.4 02/02/2018   HCT 36.6 02/02/2018   MCV 99.4 02/02/2018   PLT 196 02/02/2018    Lab Results  Component Value Date   CREATININE 0.93 02/02/2018    Urinalysis    Component Value Date/Time   COLORURINE YELLOW (A) 02/01/2018 1819   APPEARANCEUR CLEAR (A) 02/01/2018 1819   LABSPEC 1.016 02/01/2018 1819   PHURINE 6.0 02/01/2018 1819   GLUCOSEU NEGATIVE 02/01/2018 1819   HGBUR SMALL (A) 02/01/2018 1819    BILIRUBINUR NEGATIVE 02/01/2018 1819   KETONESUR NEGATIVE 02/01/2018 1819   PROTEINUR NEGATIVE 02/01/2018 1819   NITRITE NEGATIVE 02/01/2018 1819   LEUKOCYTESUR SMALL (A) 02/01/2018 1819    Lab Results  Component Value Date   BACTERIA NONE SEEN 02/01/2018    Pertinent Imaging: Results for orders placed during the hospital encounter of 03/04/18  US RENAL   Narrative CLINICAL DATA:  History of proximal left ureteral stone  EXAM: RENAL / URINARY TRACT ULTRASOUND COMPLETE  COMPARISON:  02/01/2018 CT of the abdomen and pelvis.  FINDINGS: Right Kidney:  Length: 9.5 cm. Parapelvic cysts are noted. These are similar to that seen on the prior CT examination.  Left Kidney:  Length: 11.2 cm. Previously seen hydronephrosis is no longer identified. Mildly septated cyst is noted in the parapelvic region similar to that seen on prior CT examination. It measures 3.2 cm in greatest dimension.  Bladder:  The bladder is decompressed.  IMPRESSION: Resolution of previously described left-sided hydronephrosis.  No renal calculi are noted.  Bilateral parapelvic cyst. The largest of these on the left  demonstrates some internal septation. Contrast enhanced MRI may be helpful for further clarification of the cystic lesion.   Electronically Signed   By: Alcide CleverMark  Lukens M.D.   On: 03/05/2018 08:04     Results for orders placed during the hospital encounter of 02/01/18  CT Renal Stone Study   Narrative CLINICAL DATA:  Left-sided flank pain for 2 days  EXAM: CT ABDOMEN AND PELVIS WITHOUT CONTRAST  TECHNIQUE: Multidetector CT imaging of the abdomen and pelvis was performed following the standard protocol without IV contrast.  COMPARISON:  None.  FINDINGS: Lower chest: No acute abnormality.  Hepatobiliary: No focal liver abnormality is seen. No gallstones, gallbladder wall thickening, or biliary dilatation.  Pancreas: Unremarkable. No pancreatic ductal dilatation  or surrounding inflammatory changes.  Spleen: Normal in size without focal abnormality.  Adrenals/Urinary Tract: The adrenal glands are within normal limits bilaterally. Kidneys are well visualized bilaterally. The right kidney is within normal limits with scattered parapelvic cysts identified. The left kidney also demonstrates mild peripelvic cysts although hydronephrosis and proximal hydroureter is noted secondary to a 6 mm proximal left ureteral stone. The more distal left ureter is within normal limits. Tiny left nonobstructing renal calculi are noted. The bladder is decompressed.  Stomach/Bowel: Scattered diverticular change of the colon is noted without evidence of diverticulitis. No obstructive or inflammatory changes are seen. The appendix is not well visualized. No inflammatory changes are noted. Hiatal hernia is noted.  Vascular/Lymphatic: Aortic atherosclerosis. No enlarged abdominal or pelvic lymph nodes.  Reproductive: Status post hysterectomy. No adnexal masses.  Other: No abdominal wall hernia or abnormality. No abdominopelvic ascites.  Musculoskeletal: Degenerative changes are noted.  IMPRESSION: Left proximal ureteral stone with obstructive change.  Tiny nonobstructing left renal calculi.  Chronic changes as described above.   Electronically Signed   By: Alcide CleverMark  Lukens M.D.   On: 02/01/2018 20:59    Renal ultrasound and CT scan were personally reviewed again today.  Assessment & Plan:    1. Acquired cyst of kidney Incidental probable Bosniak 2 versus 2 out of renal cyst on left We discussed the Bosniak classification system as well as risk factors for malignancy based on radiographic findings MRI could be helpful in elucidating whether or not this lesion needs to be followed Alternative options include serial ultrasound in 6 to 12 months She is more interested in MRI for more definitive evaluation, we will call her with the results if they are benign  we will see her in the office if further surveillance is indicated She is agreeable this plan - MR Abdomen W Wo Contrast; Future  2. History of nephrolithiasis Status post left ureteroscopy We discussed general stone prevention techniques including drinking plenty water with goal of producing 2.5 L urine daily, increased citric acid intake, avoidance of high oxalate containing foods, and decreased salt intake.  Information about dietary recommendations given today. No residual silent hydronephrosis No further stones on CT scan or ultrasound    Return in about 4 weeks (around 04/12/2018) for f/u MRI Carollee Herter(Shannon or me).  Vanna ScotlandAshley Luiza Carranco, MD  [facility] [address], [address] [place], [place] [address] [phone]

## 2018-04-03 ENCOUNTER — Ambulatory Visit
Admission: RE | Admit: 2018-04-03 | Discharge: 2018-04-03 | Disposition: A | Discharge status: Home / Self Care | Payer: Medicare HMO | Source: Ambulatory Visit | Attending: Urology | Admitting: Urology

## 2018-04-03 DIAGNOSIS — K824 Cholesterolosis of gallbladder: Secondary | ICD-10-CM | POA: Insufficient documentation

## 2018-04-03 DIAGNOSIS — K5730 Diverticulosis of large intestine without perforation or abscess without bleeding: Secondary | ICD-10-CM | POA: Insufficient documentation

## 2018-04-03 DIAGNOSIS — N281 Cyst of kidney, acquired: Secondary | ICD-10-CM

## 2018-04-03 LAB — POCT I-STAT CREATININE: Creatinine, Ser: 0.7 mg/dL (ref 0.44–1.00)

## 2018-04-03 MED ORDER — GADOBUTROL 1 MMOL/ML IV SOLN
5.0000 mL | Freq: Once | INTRAVENOUS | Status: AC | PRN
  Administered 2018-04-03: 5 mL via INTRAVENOUS

## 2018-04-07 ENCOUNTER — Telehealth: Payer: Self-pay | Admitting: Family Medicine

## 2018-04-07 NOTE — Telephone Encounter (Signed)
Patient notified . Appointment cancelled.

## 2018-04-07 NOTE — Telephone Encounter (Signed)
-----   Message from Vanna Scotland, MD sent at 04/06/2018  4:16 PM EDT ----- Please let this patient know that her MRI looked great.  She had some simple renal cyst but nothing worrisome.  She does not need any further evaluation or treatment.  She may follow-up as needed.  Please cancel her follow-up appointment.  Vanna Scotland, MD

## 2018-04-19 ENCOUNTER — Ambulatory Visit: Payer: Medicare HMO | Admitting: Urology

## 2019-09-05 IMAGING — DX DG ABDOMEN 1V
1 series · 1 of 1 positions shown · non-contrast
Comparison: CT from yesterday

CLINICAL DATA: Left flank pain and kidney stone

EXAM:
ABDOMEN - 1 VIEW

[abdomen supine]
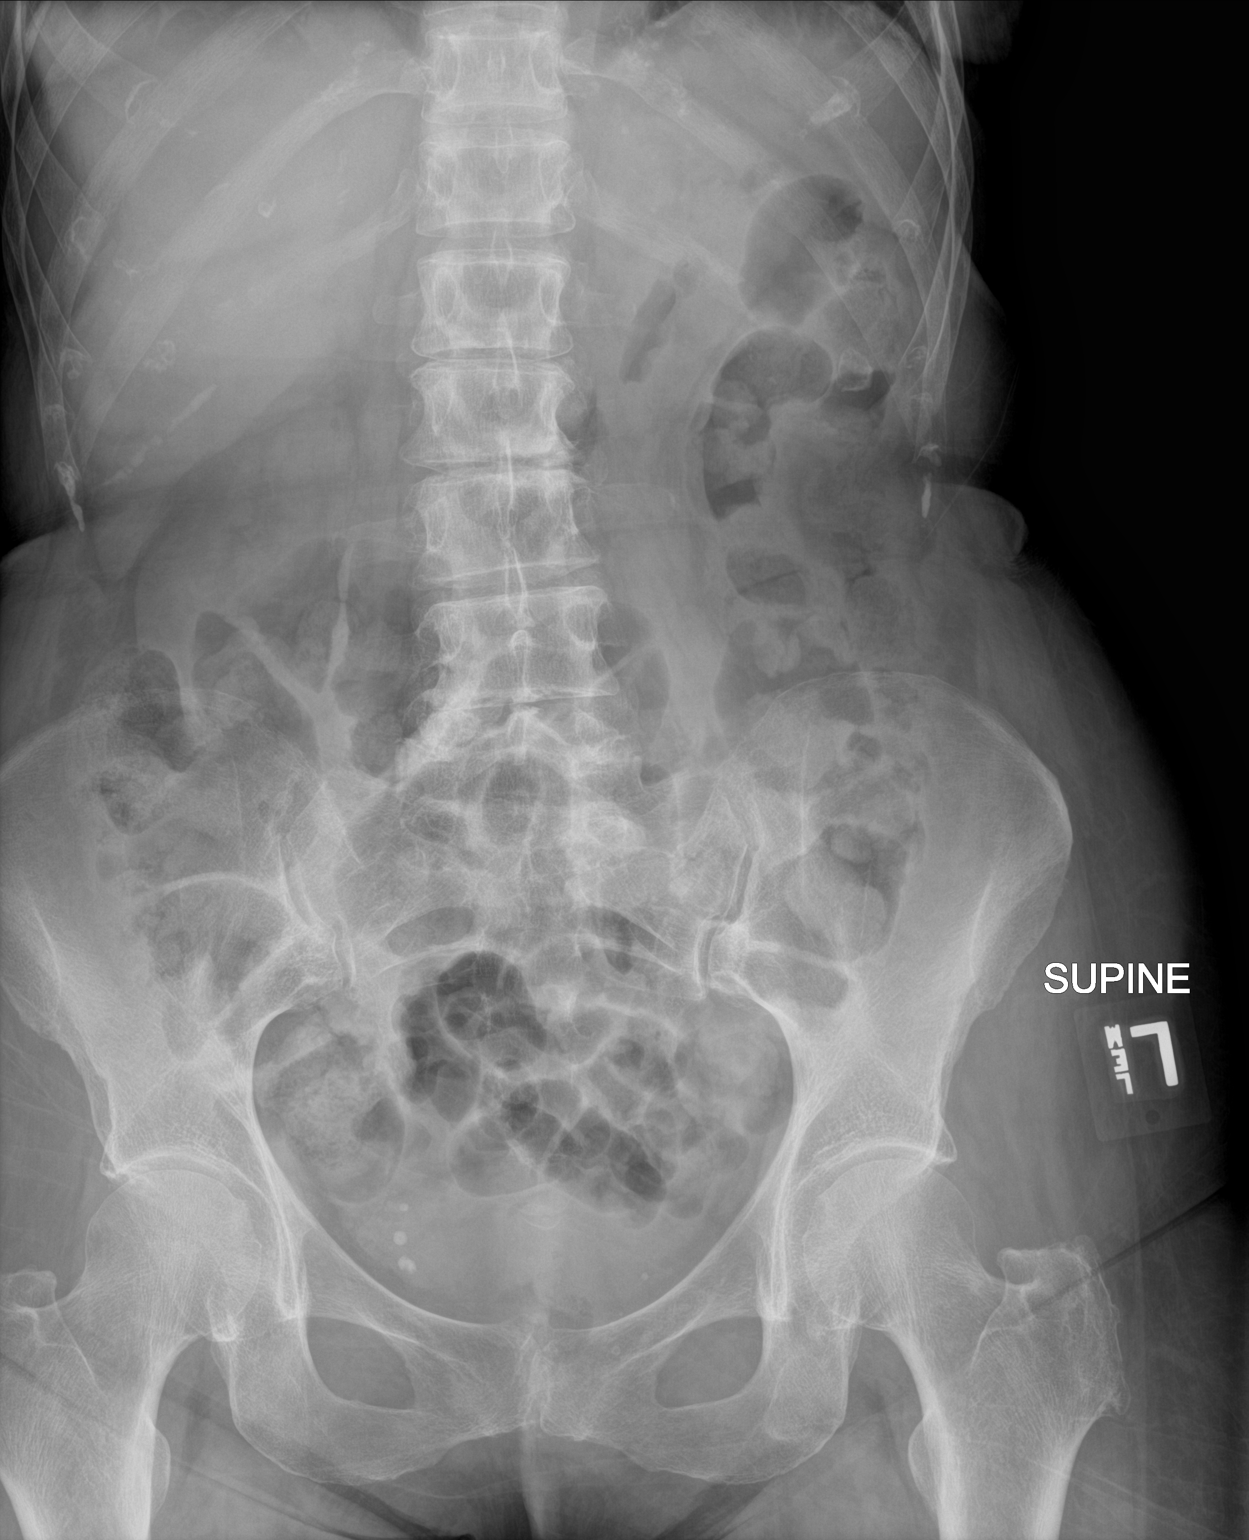

[1 of 1 positions shown; findings below may reference images not displayed]

FINDINGS: Limited in evaluating the ureters due to extent of small bowel gas.

Left upper ureteral stone seen on prior is no longer visualized.
There are pelvic calcifications consistent with phleboliths based on
prior CT.

Normal bowel gas pattern. Lumbar spine degeneration and mild
dextrocurvature.
IMPRESSION: Left ureteral stone by CT yesterday is not clearly visualized on
this study.

## 2019-09-11 ENCOUNTER — Other Ambulatory Visit: Payer: Self-pay | Admitting: Neurology

## 2019-09-11 DIAGNOSIS — G252 Other specified forms of tremor: Secondary | ICD-10-CM

## 2019-09-25 ENCOUNTER — Ambulatory Visit
Admission: RE | Admit: 2019-09-25 | Discharge: 2019-09-25 | Disposition: A | Discharge status: Home / Self Care | Payer: Medicare HMO | Source: Ambulatory Visit | Attending: Neurology | Admitting: Neurology

## 2019-09-25 ENCOUNTER — Other Ambulatory Visit: Payer: Self-pay

## 2019-09-25 DIAGNOSIS — G252 Other specified forms of tremor: Secondary | ICD-10-CM | POA: Diagnosis present

## 2019-11-04 IMAGING — MR MR ABDOMEN WO/W CM
20 series · 48 of 48 positions shown · IV contrast (Gadavist)
Comparison: 03/04/2018 renal sonogram. 02/01/2018 CT
abdomen/pelvis.

CLINICAL DATA: Complex left renal cysts at sonography. History of
nephrolithiasis.

EXAM:
MRI ABDOMEN WITHOUT AND WITH CONTRAST
TECHNIQUE: Multiplanar multisequence MR imaging of the abdomen was performed
both before and after the administration of intravenous contrast.
CONTRAST:  5 cc Gadavist IV.

[Series 3: cor haste · coronal · 6.0mm · 1.19mm/px · 2 of 26 slices shown]
[im 1/26]
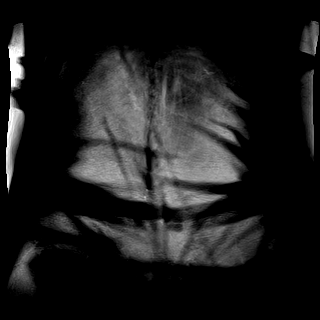
[im 26/26]
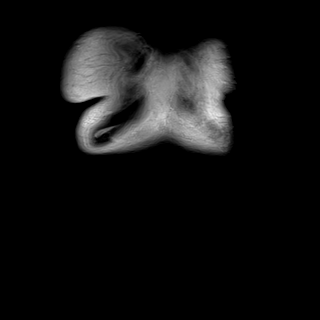

[Series 6: ax in & · axial · 3.0mm · 1.19mm/px · z∈[-233,-20]mm · 4 of 72 slices shown (1 of 2)]
[im 1/72]
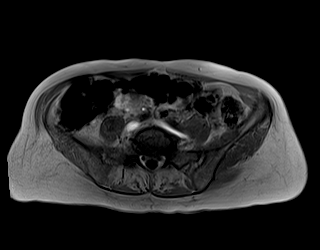
[im 24/72]
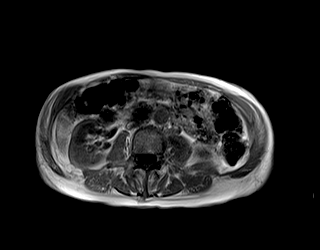
[im 48/72]
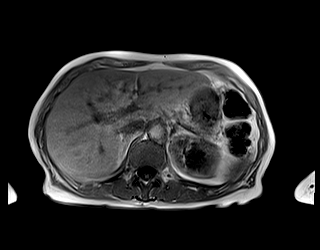
[im 72/72]
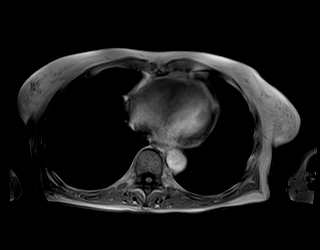

[Series 6: ax in & · axial · 3.0mm · 1.19mm/px · z∈[-233,-20]mm · 4 of 72 slices shown (2 of 2)]
[im 1/72]
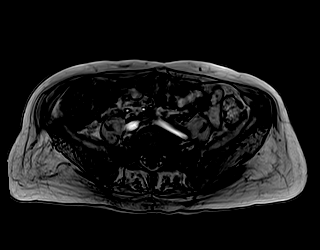
[im 24/72]
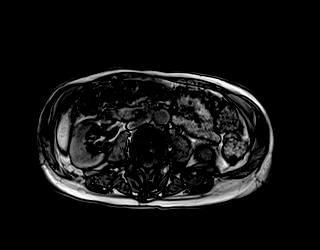
[im 48/72]
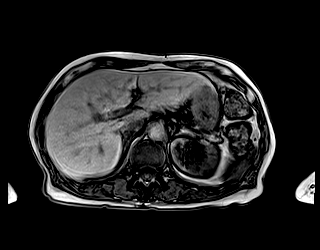
[im 72/72]
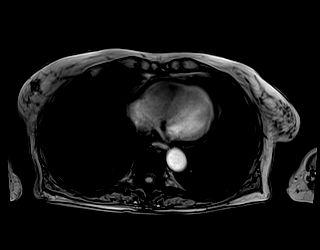

[Series 7: DWI · axial · 6.0mm · 1.42mm/px · z∈[-231,-22]mm · 2 of 30 slices shown (1 of 4)]
[im 1/30]
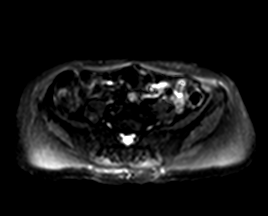
[im 30/30]
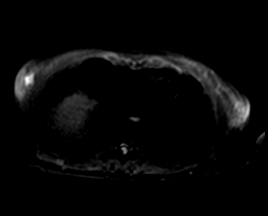

[Series 7: DWI · axial · 6.0mm · 1.42mm/px · 1 of 30 slices shown (2 of 4)]
[im 1/30]
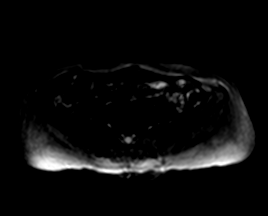

[Series 7: DWI · axial · 6.0mm · 1.42mm/px · 1 of 30 slices shown (3 of 4)]
[im 1/30]
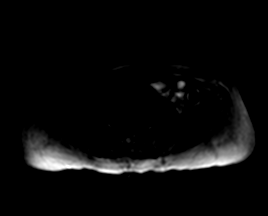

[Series 8: DWI · axial · 6.0mm · 1.42mm/px · 1 of 30 slices shown (4 of 4)]
[im 1/30]
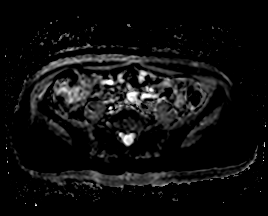

[Series 9: T2 fat-sat · axial · 6.0mm · 1.19mm/px · 1 of 34 slices shown]
[im 1/34]
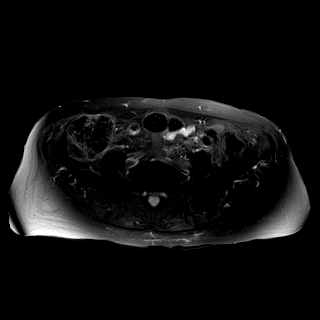

[Series 10: bSSFP · axial · 6.0mm · 0.74mm/px · 1 of 34 slices shown]
[im 1/34]
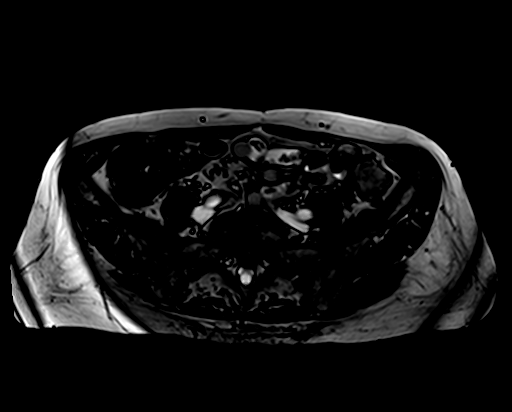

[Series 11: T1 dynamic · axial · non-contrast · 3.5mm · 1.19mm/px · z∈[-251,-2]mm · 3 of 72 slices shown (1 of 9)]
[im 1/72]
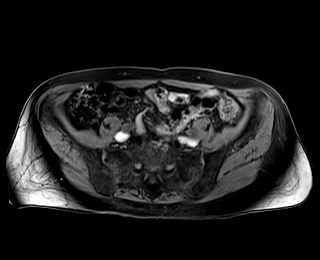
[im 36/72]
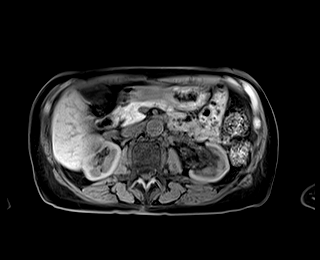
[im 72/72]
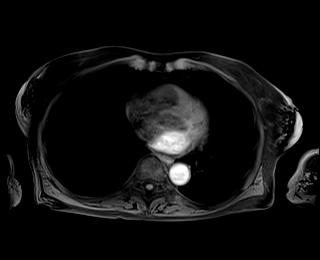

[Series 12: T1 dynamic · axial · 3.5mm · 1.19mm/px · z∈[-251,-2]mm · 3 of 72 slices shown (2 of 9)]
[im 1/72]
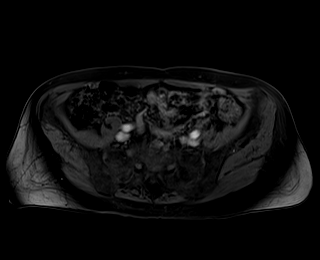
[im 36/72]
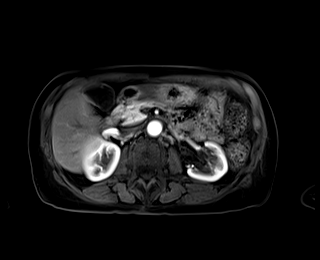
[im 72/72]
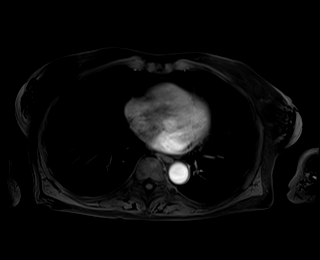

[Series 13: T1 dynamic · axial · 3.5mm · 1.19mm/px · z∈[-251,-2]mm · 3 of 72 slices shown (3 of 9)]
[im 1/72]
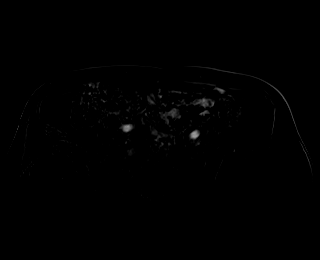
[im 36/72]
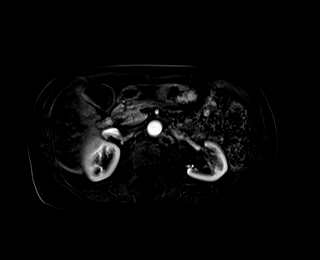
[im 72/72]
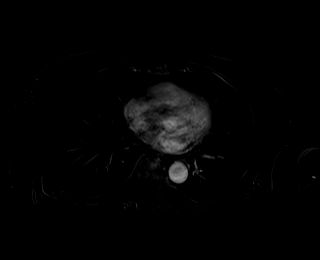

[Series 14: T1 dynamic · axial · 3.5mm · 1.19mm/px · z∈[-251,-2]mm · 3 of 72 slices shown (4 of 9)]
[im 1/72]
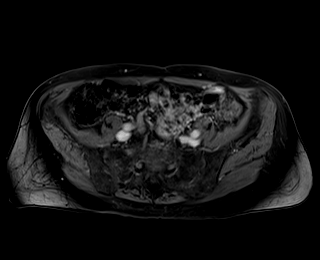
[im 36/72]
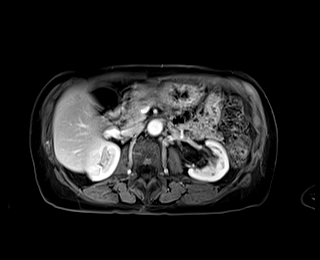
[im 72/72]
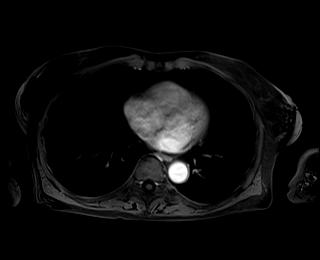

[Series 15: T1 dynamic · axial · 3.5mm · 1.19mm/px · z∈[-251,-2]mm · 3 of 72 slices shown (5 of 9)]
[im 1/72]
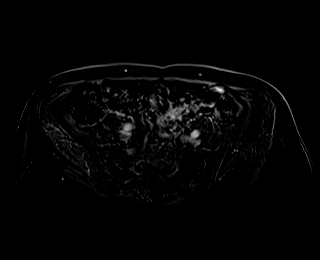
[im 36/72]
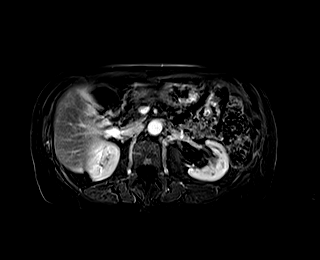
[im 72/72]
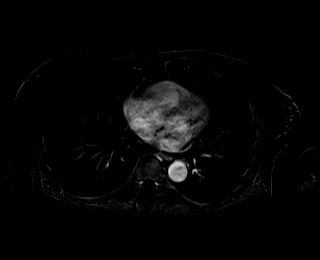

[Series 16: T1 dynamic · axial · 3.5mm · 1.19mm/px · z∈[-251,-2]mm · 3 of 72 slices shown (6 of 9)]
[im 1/72]
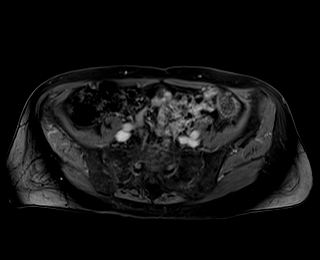
[im 36/72]
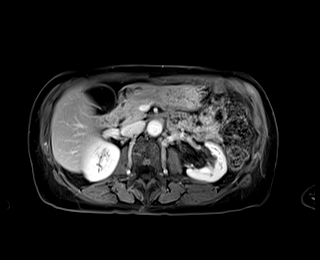
[im 72/72]
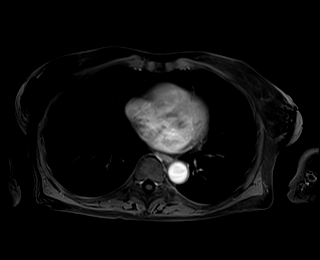

[Series 17: T1 dynamic · axial · 3.5mm · 1.19mm/px · z∈[-251,-2]mm · 3 of 72 slices shown (7 of 9)]
[im 1/72]
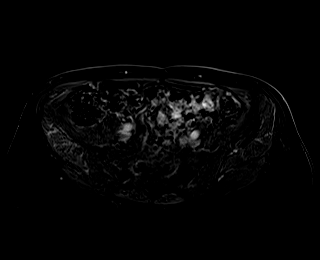
[im 36/72]
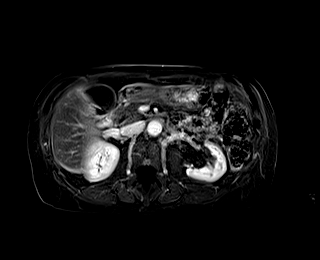
[im 72/72]
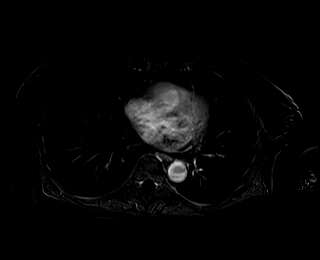

[Series 18: T1 dynamic post-contrast · coronal · 3.0mm · 1.19mm/px · 3 of 72 slices shown]
[im 1/72]
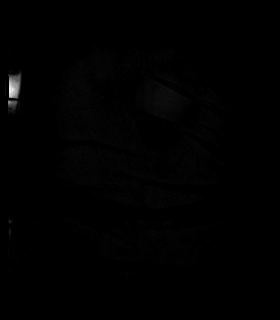
[im 36/72]
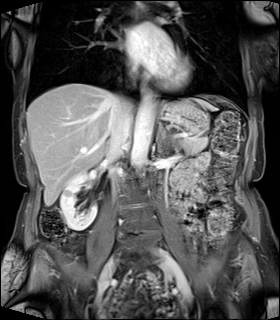
[im 72/72]
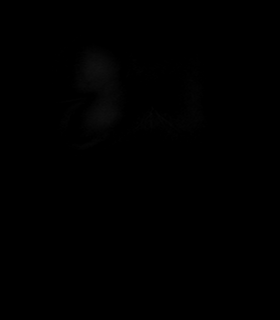

[Series 19: T2 · axial · 6.0mm · 1.19mm/px · 1 of 34 slices shown]
[im 1/34]
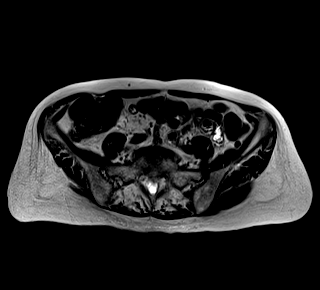

[Series 20: T1 dynamic · axial · 3.5mm · 1.19mm/px · z∈[-251,-2]mm · 3 of 72 slices shown (8 of 9)]
[im 1/72]
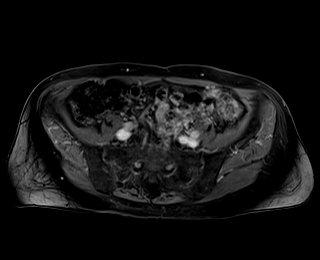
[im 36/72]
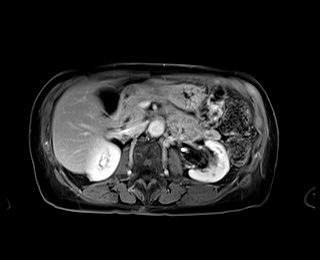
[im 72/72]
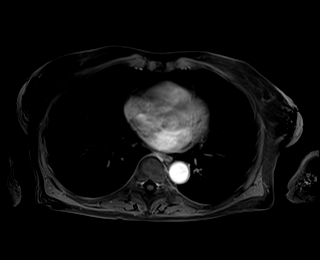

[Series 21: T1 dynamic · axial · 3.5mm · 1.19mm/px · z∈[-251,-2]mm · 3 of 72 slices shown (9 of 9)]
[im 1/72]
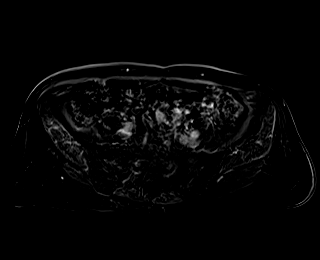
[im 36/72]
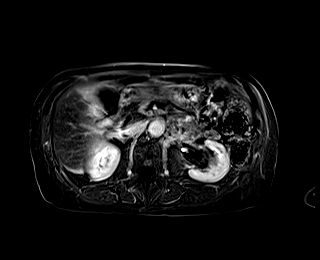
[im 72/72]
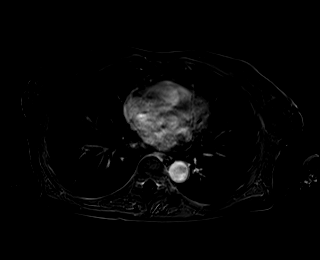

[48 of 48 positions shown; findings below may reference images not displayed]

FINDINGS: Lower chest: No acute abnormality at the lung bases.

Hepatobiliary: Normal liver size and configuration. No hepatic
steatosis. No liver mass. Solitary 3 mm gallbladder polyp in the
anterior gallbladder wall. No cholelithiasis. No gallbladder wall
thickening. No pericholecystic fluid. No biliary ductal dilatation.
Common bile duct diameter 4 mm. No choledocholithiasis.

Pancreas: No pancreatic mass or duct dilation.  No pancreas divisum.

Spleen: Normal size. No mass.

Adrenals/Urinary Tract: Normal adrenals. No hydronephrosis. Simple
1.2 cm renal cortical cyst in the interpolar right kidney. Several
subjacent simple parapelvic renal cysts in the bilateral kidneys,
largest 2.6 cm in the upper left kidney. No suspicious renal masses.

Stomach/Bowel: Normal non-distended stomach. Visualized small and
large bowel is normal caliber, with no bowel wall thickening. Left
colonic diverticulosis.

Vascular/Lymphatic: Normal caliber abdominal aorta. Patent portal,
splenic, hepatic and renal veins. No pathologically enlarged lymph
nodes in the abdomen.

Other: No abdominal ascites or focal fluid collection.

Musculoskeletal: No aggressive appearing focal osseous lesions.
IMPRESSION: 1. Simple parapelvic and renal cortical cysts in the kidneys. No
suspicious renal masses.
2. Solitary 3 mm gallbladder polyp, compatible with a benign
cholesterol polyp. No follow-up required. This recommendation
follows ACR consensus guidelines: White Paper of the ACR Incidental
Findings Committee II on Gallbladder and Biliary Findings. [HOSPITAL] 3131:;[DATE].
3. Left colonic diverticulosis.

## 2021-01-21 ENCOUNTER — Encounter: Payer: Self-pay | Admitting: Internal Medicine

## 2021-01-22 ENCOUNTER — Ambulatory Visit: Payer: Medicare HMO | Admitting: Certified Registered"

## 2021-01-22 ENCOUNTER — Encounter
Admission: RE | Disposition: A | Discharge status: Home / Self Care | Payer: Self-pay | Source: Home / Self Care | Attending: Internal Medicine

## 2021-01-22 ENCOUNTER — Ambulatory Visit
Admission: RE | Admit: 2021-01-22 | Discharge: 2021-01-22 | Disposition: A | Discharge status: Home / Self Care | Payer: Medicare HMO | Attending: Internal Medicine | Admitting: Internal Medicine

## 2021-01-22 DIAGNOSIS — K2100 Gastro-esophageal reflux disease with esophagitis, without bleeding: Secondary | ICD-10-CM | POA: Diagnosis not present

## 2021-01-22 DIAGNOSIS — R413 Other amnesia: Secondary | ICD-10-CM | POA: Diagnosis not present

## 2021-01-22 DIAGNOSIS — Z7982 Long term (current) use of aspirin: Secondary | ICD-10-CM | POA: Insufficient documentation

## 2021-01-22 DIAGNOSIS — G40409 Other generalized epilepsy and epileptic syndromes, not intractable, without status epilepticus: Secondary | ICD-10-CM | POA: Insufficient documentation

## 2021-01-22 DIAGNOSIS — K640 First degree hemorrhoids: Secondary | ICD-10-CM | POA: Diagnosis not present

## 2021-01-22 DIAGNOSIS — K2970 Gastritis, unspecified, without bleeding: Secondary | ICD-10-CM | POA: Insufficient documentation

## 2021-01-22 DIAGNOSIS — Z79899 Other long term (current) drug therapy: Secondary | ICD-10-CM | POA: Insufficient documentation

## 2021-01-22 DIAGNOSIS — K591 Functional diarrhea: Secondary | ICD-10-CM | POA: Diagnosis present

## 2021-01-22 DIAGNOSIS — Z886 Allergy status to analgesic agent status: Secondary | ICD-10-CM | POA: Diagnosis not present

## 2021-01-22 DIAGNOSIS — K5730 Diverticulosis of large intestine without perforation or abscess without bleeding: Secondary | ICD-10-CM | POA: Diagnosis not present

## 2021-01-22 DIAGNOSIS — Z888 Allergy status to other drugs, medicaments and biological substances status: Secondary | ICD-10-CM | POA: Insufficient documentation

## 2021-01-22 HISTORY — DX: Tremor, unspecified: R25.1

## 2021-01-22 HISTORY — PX: ESOPHAGOGASTRODUODENOSCOPY (EGD) WITH PROPOFOL: SHX5813

## 2021-01-22 HISTORY — PX: COLONOSCOPY WITH PROPOFOL: SHX5780

## 2021-01-22 SURGERY — ESOPHAGOGASTRODUODENOSCOPY (EGD) WITH PROPOFOL
Anesthesia: General

## 2021-01-22 MED ORDER — SODIUM CHLORIDE 0.9 % IV SOLN
INTRAVENOUS | Status: DC
  Administered 2021-01-22: 20 mL/h via INTRAVENOUS

## 2021-01-22 MED ORDER — PROPOFOL 10 MG/ML IV BOLUS
INTRAVENOUS | Status: DC | PRN
  Administered 2021-01-22: 60 mg via INTRAVENOUS
  Administered 2021-01-22: 10 mg via INTRAVENOUS
  Administered 2021-01-22 (×2): 20 mg via INTRAVENOUS

## 2021-01-22 MED ORDER — LIDOCAINE HCL (CARDIAC) PF 100 MG/5ML IV SOSY
PREFILLED_SYRINGE | INTRAVENOUS | Status: DC | PRN
  Administered 2021-01-22: 100 mg via INTRAVENOUS

## 2021-01-22 MED ORDER — PROPOFOL 500 MG/50ML IV EMUL
INTRAVENOUS | Status: DC | PRN
  Administered 2021-01-22: 145 ug/kg/min via INTRAVENOUS

## 2021-01-22 NOTE — Anesthesia Procedure Notes (Signed)
Procedure Name: General with mask airway Date/Time: 01/22/2021 1:27 PM Performed by: Mohammed Kindle, CRNA Pre-anesthesia Checklist: Patient identified, Emergency Drugs available, Suction available and Patient being monitored Patient Re-evaluated:Patient Re-evaluated prior to induction Oxygen Delivery Method: Simple face mask Induction Type: IV induction Placement Confirmation: positive ETCO2 and CO2 detector Dental Injury: Teeth and Oropharynx as per pre-operative assessment

## 2021-01-22 NOTE — Anesthesia Preprocedure Evaluation (Signed)
Anesthesia Evaluation  Patient identified by MRN, date of birth, ID band Patient awake    Reviewed: Allergy & Precautions, H&P , NPO status , Patient's Chart, lab work & pertinent test results, reviewed documented beta blocker date and time   Airway Mallampati: II   Neck ROM: full    Dental  (+) Poor Dentition   Pulmonary neg pulmonary ROS, former smoker,    Pulmonary exam normal        Cardiovascular Exercise Tolerance: Good negative cardio ROS Normal cardiovascular exam Rhythm:regular Rate:Normal     Neuro/Psych Seizures -,   Neuromuscular disease negative psych ROS   GI/Hepatic negative GI ROS, Neg liver ROS,   Endo/Other  negative endocrine ROS  Renal/GU Renal disease  negative genitourinary   Musculoskeletal   Abdominal   Peds  Hematology negative hematology ROS (+)   Anesthesia Other Findings Past Medical History: No date: Anxiety No date: Carpal tunnel syndrome No date: Dizziness and giddiness No date: Generalized convulsive epilepsy without mention of  intractable epilepsy No date: Memory loss No date: Tremor No date: Unspecified adverse effect of unspecified drug, medicinal  and biological substance Past Surgical History: No date: ABDOMINAL HYSTERECTOMY No date: APPENDECTOMY 02/02/2018: CYSTOSCOPY WITH STENT PLACEMENT; Left     Comment:  Procedure: CYSTOSCOPY WITH STENT PLACEMENT;  Surgeon:               Vanna Scotland, MD;  Location: [facility];  Service:               Urology;  Laterality: Left; 02/02/2018: CYSTOSCOPY/RETROGRADE/URETEROSCOPY; Left     Comment:  Procedure: CYSTOSCOPY/RETROGRADE/URETEROSCOPY;  Surgeon:              Vanna Scotland, MD;  Location: [facility];  Service:               Urology;  Laterality: Left; No date: TONSILLECTOMY AND ADENOIDECTOMY BMI    Body Mass Index: 21.97 kg/m     Reproductive/Obstetrics negative OB ROS                              Anesthesia Physical Anesthesia Plan  ASA: 2  Anesthesia Plan: General   Post-op Pain Management:    Induction:   PONV Risk Score and Plan:   Airway Management Planned:   Additional Equipment:   Intra-op Plan:   Post-operative Plan:   Informed Consent: I have reviewed the patients History and Physical, chart, labs and discussed the procedure including the risks, benefits and alternatives for the proposed anesthesia with the patient or authorized representative who has indicated his/her understanding and acceptance.     Dental Advisory Given  Plan Discussed with: CRNA  Anesthesia Plan Comments:         Anesthesia Quick Evaluation

## 2021-01-22 NOTE — Op Note (Signed)
[facility] Patient Name: Mylah Baynes Procedure Date: 01/22/2021 1:16 PM MRN: [id number] Account #: [id number] Date of Birth: 1941-03-01 Admit Type: Outpatient Age: 80 Room: [facility] ENDO ROOM 4 Gender: Female Note Status: Finalized Procedure:             Colonoscopy Indications:           Functional diarrhea, Heme positive stool Providers:             Boykin Nearing. Norma Fredrickson MD, MD Referring MD:          Jillene Bucks. Arlana Pouch, MD (Referring MD) Medicines:             Propofol per Anesthesia Complications:         No immediate complications. Procedure:             Pre-Anesthesia Assessment:                        - The risks and benefits of the procedure and the                         sedation options and risks were discussed with the                         patient. All questions were answered and informed                         consent was obtained.                        - Patient identification and proposed procedure were                         verified prior to the procedure by the nurse. The                         procedure was verified in the procedure room.                        - ASA Grade Assessment: III - A patient with severe                         systemic disease.                        - After reviewing the risks and benefits, the patient                         was deemed in satisfactory condition to undergo the                         procedure.                        After obtaining informed consent, the colonoscope was                         passed under direct vision. Throughout the procedure,                         the patient's blood pressure,  pulse, and oxygen                         saturations were monitored continuously. The                         Colonoscope was introduced through the anus and                         advanced to the the cecum, identified by appendiceal                         orifice and ileocecal valve. The  colonoscopy was                         performed without difficulty. The patient tolerated                         the procedure well. The quality of the bowel                         preparation was adequate. The ileocecal valve,                         appendiceal orifice, and rectum were photographed. Findings:      The perianal and digital rectal examinations were normal. Pertinent       negatives include normal sphincter tone and no palpable rectal lesions.      Many small-mouthed diverticula were found in the sigmoid colon.      Normal mucosa was found in the entire colon. Biopsies for histology were       taken with a cold forceps from the random colon for evaluation of       microscopic colitis.      Non-bleeding internal hemorrhoids were found during retroflexion. The       hemorrhoids were Grade I (internal hemorrhoids that do not prolapse).      The exam was otherwise without abnormality. Impression:            - Diverticulosis in the sigmoid colon.                        - Normal mucosa in the entire examined colon. Biopsied.                        - Non-bleeding internal hemorrhoids.                        - The examination was otherwise normal. Recommendation:        - Patient has a contact number available for                         emergencies. The signs and symptoms of potential                         delayed complications were discussed with the patient.                         Return to normal activities tomorrow. Written  discharge instructions were provided to the patient.                        - Await pathology results from EGD, also performed                         today.                        - Resume previous diet.                        - Continue present medications.                        - Await pathology results.                        - No repeat colonoscopy due to current age (3 years                         or older) and the  absence of colonic polyps.                        - You do NOT require further colon cancer screening                         measures (Annual stool testing (i.e. hemoccult, FIT,                         cologuard), sigmoidoscopy, colonoscopy or CT                         colonography). You should share this recommendation                         with your Primary Care provider.                        - Return to physician assistant in 2 months.                        - Follow up with Tawni Pummel, PA-C at [facility]. [phone].                        - The findings and recommendations were discussed with                         the patient. Procedure Code(s):     --- Professional ---                        [phone], Colonoscopy, flexible; with biopsy, single or                         multiple Diagnosis Code(s):     --- Professional ---  K57.30, Diverticulosis of large intestine without                         perforation or abscess without bleeding                        R19.5, Other fecal abnormalities                        K59.1, Functional diarrhea                        K64.0, First degree hemorrhoids CPT copyright 2019 American Medical Association. All rights reserved. The codes documented in this report are preliminary and upon coder review may  be revised to meet current compliance requirements. Stanton Kidney MD, MD 01/22/2021 1:41:09 PM This report has been signed electronically. Number of Addenda: 0 Note Initiated On: 01/22/2021 1:16 PM Scope Withdrawal Time: 0 hours 4 minutes 35 seconds  Total Procedure Duration: 0 hours 9 minutes 10 seconds  Estimated Blood Loss:  Estimated blood loss: none.      [facility]

## 2021-01-22 NOTE — Interval H&P Note (Signed)
History and Physical Interval Note:  01/22/2021 12:00 PM  Nicole Burnett  has presented today for surgery, with the diagnosis of positive stool, diarrhea, GERD.  The various methods of treatment have been discussed with the patient and family. After consideration of risks, benefits and other options for treatment, the patient has consented to  Procedure(s): ESOPHAGOGASTRODUODENOSCOPY (EGD) WITH PROPOFOL (N/A) COLONOSCOPY WITH PROPOFOL (N/A) as a surgical intervention.  The patient's history has been reviewed, patient examined, no change in status, stable for surgery.  I have reviewed the patient's chart and labs.  Questions were answered to the patient's satisfaction.     Farn[place], [place]

## 2021-01-22 NOTE — Transfer of Care (Signed)
Immediate Anesthesia Transfer of Care Note  Patient: Nicole Burnett  Procedure(s) Performed: ESOPHAGOGASTRODUODENOSCOPY (EGD) WITH PROPOFOL COLONOSCOPY WITH PROPOFOL  Patient Location: Endoscopy Unit  Anesthesia Type:General  Level of Consciousness: awake, drowsy and patient cooperative  Airway & Oxygen Therapy: Patient Spontanous Breathing and Patient connected to face mask oxygen  Post-op Assessment: Report given to RN and Post -op Vital signs reviewed and stable  Post vital signs: Reviewed and stable  Last Vitals:  Vitals Value Taken Time  BP 72/40 01/22/21 1339  Temp 36.8 C 01/22/21 1339  Pulse 44 01/22/21 1341  Resp 14 01/22/21 1341  SpO2 100 % 01/22/21 1341  Vitals shown include unvalidated device data.  Last Pain:  Vitals:   01/22/21 1339  TempSrc: Temporal  PainSc: Asleep         Complications: No notable events documented.

## 2021-01-22 NOTE — H&P (Signed)
Outpatient short stay form Pre-procedure 01/22/2021 11:58 AM Nicole Burnett, M.D.  Primary Physician: Dewaine Oats, M.D.  Reason for visit:  Epigastric pain, GERD, functional diarrhea, positive hemoccult test  History of present illness:  Patient with epigastric "burning" and recurrent watery diarrhea. Has hx of GERD.   No current facility-administered medications for this encounter.  Medications Prior to Admission  Medication Sig Dispense Refill Last Dose   EPINEPHrine (EPIPEN 2-PAK) 0.3 mg/0.3 mL IJ SOAJ injection Inject 0.3 mg into the muscle as needed for anaphylaxis.      pantoprazole (PROTONIX) 40 MG tablet Take 40 mg by mouth daily.      propranolol (INDERAL) 60 MG tablet Take 60 mg by mouth 3 (three) times daily.      acetaminophen (TYLENOL) 325 MG tablet Take 2 tablets (650 mg total) by mouth every 6 (six) hours as needed for mild pain (or Fever >/= 101).      aspirin 81 MG tablet Take 81 mg by mouth daily.      brimonidine (ALPHAGAN P) 0.1 % SOLN Place 5 drops into the left eye 2 (two) times daily.       Diclofenac-Misoprostol (ARTHROTEC) 75-0.2 MG TBEC Take 1 tablet by mouth every other day.       latanoprost (XALATAN) 0.005 % ophthalmic solution Place 1 drop into both eyes at bedtime.       Multiple Vitamins-Minerals (CENTRUM SILVER PO) Take 1 tablet by mouth daily.       Polyethyl Glycol-Propyl Glycol (SYSTANE) 0.4-0.3 % SOLN Apply 1 application to eye 2 (two) times daily.       zonisamide (ZONEGRAN) 100 MG capsule TAKE 1 CAPSULE BY MOUTH DAILY AND 2 CAPSULES EVERY NIGHT AT BEDTIME (Patient taking differently: Take 100-200 mg by mouth 2 (two) times daily. TAKE 1 CAPSULE BY MOUTH AT LUNCH AND 2 CAPSULES EVERY NIGHT AT BEDTIME) 90 capsule 0      Allergies  Allergen Reactions   Bee Venom Anaphylaxis   Lamotrigine Rash   Levetiracetam Rash   Motrin [Ibuprofen] Rash   Pyridoxine Rash     Past Medical History:  Diagnosis Date   Anxiety    Carpal tunnel syndrome     Dizziness and giddiness    Generalized convulsive epilepsy without mention of intractable epilepsy    Memory loss    Tremor    Unspecified adverse effect of unspecified drug, medicinal and biological substance     Review of systems:  Otherwise negative.    Physical Exam  Gen: Alert, oriented. Appears stated age.  HEENT: [place]/AT. PERRLA. Lungs: CTA, no wheezes. CV: RR nl S1, S2. Abd: soft, benign, no masses. BS+ Ext: No edema. Pulses 2+    Planned procedures: Proceed with EGD and colonoscopy. The patient understands the nature of the planned procedure, indications, risks, alternatives and potential complications including but not limited to bleeding, infection, perforation, damage to internal organs and possible oversedation/side effects from anesthesia. The patient agrees and gives consent to proceed.  Please refer to procedure notes for findings, recommendations and patient disposition/instructions.     Nicole Burnett, M.D. Gastroenterology 01/22/2021  11:58 AM

## 2021-01-22 NOTE — Op Note (Signed)
[facility] Patient Name: Nicole Burnett Procedure Date: 01/22/2021 1:17 PM MRN: [id number] Account #: [id number] Date of Birth: 14-Sep-1940 Admit Type: Outpatient Age: 80 Room: [facility] ENDO ROOM 4 Gender: Female Note Status: Finalized Procedure:             Upper GI endoscopy Indications:           Gastro-esophageal reflux disease, Diarrhea Providers:             Boykin Nearing. Norma Fredrickson MD, MD Referring MD:          Jillene Bucks. Arlana Pouch, MD (Referring MD) Medicines:             Propofol per Anesthesia Complications:         No immediate complications. Procedure:             Pre-Anesthesia Assessment:                        - The risks and benefits of the procedure and the                         sedation options and risks were discussed with the                         patient. All questions were answered and informed                         consent was obtained.                        - Patient identification and proposed procedure were                         verified prior to the procedure by the nurse. The                         procedure was verified in the procedure room.                        - ASA Grade Assessment: III - A patient with severe                         systemic disease.                        - After reviewing the risks and benefits, the patient                         was deemed in satisfactory condition to undergo the                         procedure.                        After obtaining informed consent, the endoscope was                         passed under direct vision. Throughout the procedure,                         the patient's blood  pressure, pulse, and oxygen                         saturations were monitored continuously. The Endoscope                         was introduced through the mouth, and advanced to the                         third part of duodenum. The upper GI endoscopy was                         accomplished  without difficulty. The patient tolerated                         the procedure well. Findings:      The esophagus was normal.      Localized minimal inflammation characterized by erythema was found in       the gastric antrum.      The cardia and gastric fundus were normal on retroflexion.      The examined duodenum was normal. Impression:            - Normal esophagus.                        - Gastritis.                        - Normal examined duodenum.                        - No specimens collected. Recommendation:        - Proceed with colonoscopy Procedure Code(s):     --- Professional ---                        [phone], Esophagogastroduodenoscopy, flexible,                         transoral; diagnostic, including collection of                         specimen(s) by brushing or washing, when performed                         (separate procedure) Diagnosis Code(s):     --- Professional ---                        R19.7, Diarrhea, unspecified                        K21.9, Gastro-esophageal reflux disease without                         esophagitis                        K29.70, Gastritis, unspecified, without bleeding CPT copyright 2019 American Medical Association. All rights reserved. The codes documented in this report are preliminary and upon coder review may  be revised to meet current compliance requirements. Stanton Kidney MD, MD 01/22/2021 1:25:32 PM This report has been  signed electronically. Number of Addenda: 0 Note Initiated On: 01/22/2021 1:17 PM Estimated Blood Loss:  Estimated blood loss: none. Estimated blood loss: none.      [facility]

## 2021-01-23 ENCOUNTER — Encounter: Payer: Self-pay | Admitting: Internal Medicine

## 2021-01-23 LAB — SURGICAL PATHOLOGY

## 2021-01-23 NOTE — Anesthesia Postprocedure Evaluation (Signed)
Anesthesia Post Note  Patient: Nicole Burnett  Procedure(s) Performed: ESOPHAGOGASTRODUODENOSCOPY (EGD) WITH PROPOFOL COLONOSCOPY WITH PROPOFOL  Patient location during evaluation: PACU Anesthesia Type: General Level of consciousness: awake and alert Pain management: pain level controlled Vital Signs Assessment: post-procedure vital signs reviewed and stable Respiratory status: spontaneous breathing, nonlabored ventilation, respiratory function stable and patient connected to nasal cannula oxygen Cardiovascular status: blood pressure returned to baseline and stable Postop Assessment: no apparent nausea or vomiting Anesthetic complications: no   No notable events documented.   Last Vitals:  Vitals:   01/22/21 1339 01/22/21 1359  BP: (!) 72/40 113/70  Pulse: 69   Resp:    Temp: 36.8 C   SpO2:      Last Pain:  Vitals:   01/23/21 0741  TempSrc:   PainSc: 0-No pain                 Yevette Edwards

## 2023-05-25 ENCOUNTER — Telehealth: Payer: Self-pay

## 2023-05-25 NOTE — Telephone Encounter (Signed)
Pt notified will think about another provider in office, will call back

## 2023-05-25 NOTE — Telephone Encounter (Signed)
Copied from CRM [id number]. Topic: General - Inquiry >> May 25, 2023  8:02 AM Lennox Pippins wrote: Patient's husband, Mikey Bussing, called and wanted to know if Dr Carlynn Purl would be willing to accept his wife as a patient as well?  Nicole Burnett [phone]) is a patient of Dr Carlynn Purl.  Please contact back.  Patient's callback : [phone]

## 2023-05-26 ENCOUNTER — Other Ambulatory Visit: Payer: Self-pay | Admitting: Internal Medicine

## 2023-05-26 DIAGNOSIS — R6889 Other general symptoms and signs: Secondary | ICD-10-CM

## 2023-06-02 ENCOUNTER — Ambulatory Visit
Admission: RE | Admit: 2023-06-02 | Discharge: 2023-06-02 | Disposition: A | Discharge status: Home / Self Care | Payer: Medicare HMO | Source: Ambulatory Visit | Attending: Internal Medicine | Admitting: Internal Medicine

## 2023-06-02 DIAGNOSIS — R6889 Other general symptoms and signs: Secondary | ICD-10-CM

## 2023-09-21 ENCOUNTER — Other Ambulatory Visit: Payer: Self-pay | Admitting: Student

## 2023-09-21 DIAGNOSIS — G8929 Other chronic pain: Secondary | ICD-10-CM

## 2023-09-22 ENCOUNTER — Ambulatory Visit
Admission: RE | Admit: 2023-09-22 | Discharge: 2023-09-22 | Disposition: A | Discharge status: Home / Self Care | Source: Ambulatory Visit | Attending: Internal Medicine | Admitting: Internal Medicine

## 2023-09-22 DIAGNOSIS — G8929 Other chronic pain: Secondary | ICD-10-CM | POA: Diagnosis not present

## 2023-09-22 DIAGNOSIS — M5442 Lumbago with sciatica, left side: Secondary | ICD-10-CM | POA: Diagnosis not present

## 2023-09-22 DIAGNOSIS — R0789 Other chest pain: Secondary | ICD-10-CM | POA: Insufficient documentation

## 2023-09-26 ENCOUNTER — Ambulatory Visit
Admission: RE | Admit: 2023-09-26 | Discharge: 2023-09-26 | Disposition: A | Discharge status: Home / Self Care | Source: Ambulatory Visit | Attending: Student | Admitting: Student

## 2023-09-26 DIAGNOSIS — G8929 Other chronic pain: Secondary | ICD-10-CM | POA: Diagnosis present

## 2023-09-26 DIAGNOSIS — M5442 Lumbago with sciatica, left side: Secondary | ICD-10-CM | POA: Diagnosis present
